# Patient Record
Sex: Female | Born: 1960
Health system: Southern US, Community
[De-identification: ages and names within clinical notes are randomized; demographics above are authoritative.]

---

## 1998-01-18 ENCOUNTER — Other Ambulatory Visit: Admission: RE | Admit: 1998-01-18 | Discharge: 1998-01-18 | Payer: Self-pay | Admitting: Internal Medicine

## 1998-07-01 ENCOUNTER — Ambulatory Visit (HOSPITAL_COMMUNITY): Admission: RE | Admit: 1998-07-01 | Discharge: 1998-07-01 | Payer: Self-pay | Admitting: *Deleted

## 1998-07-15 ENCOUNTER — Ambulatory Visit (HOSPITAL_COMMUNITY): Admission: RE | Admit: 1998-07-15 | Discharge: 1998-07-15 | Payer: Self-pay | Admitting: *Deleted

## 1999-07-21 ENCOUNTER — Other Ambulatory Visit: Admission: RE | Admit: 1999-07-21 | Discharge: 1999-07-21 | Payer: Self-pay | Admitting: Internal Medicine

## 1999-08-09 ENCOUNTER — Ambulatory Visit (HOSPITAL_BASED_OUTPATIENT_CLINIC_OR_DEPARTMENT_OTHER): Admission: RE | Admit: 1999-08-09 | Discharge: 1999-08-09 | Payer: Self-pay | Admitting: Plastic Surgery

## 1999-08-09 ENCOUNTER — Encounter (INDEPENDENT_AMBULATORY_CARE_PROVIDER_SITE_OTHER): Payer: Self-pay | Admitting: Specialist

## 2000-06-05 ENCOUNTER — Inpatient Hospital Stay (HOSPITAL_COMMUNITY): Admission: EM | Admit: 2000-06-05 | Discharge: 2000-06-06 | Payer: Self-pay | Admitting: Emergency Medicine

## 2000-06-05 ENCOUNTER — Encounter: Payer: Self-pay | Admitting: Emergency Medicine

## 2000-07-09 ENCOUNTER — Ambulatory Visit (HOSPITAL_COMMUNITY): Admission: RE | Admit: 2000-07-09 | Discharge: 2000-07-09 | Payer: Self-pay | Admitting: Gastroenterology

## 2000-07-11 ENCOUNTER — Encounter: Admission: RE | Admit: 2000-07-11 | Discharge: 2000-07-11 | Payer: Self-pay | Admitting: Gastroenterology

## 2000-07-11 ENCOUNTER — Encounter: Payer: Self-pay | Admitting: Gastroenterology

## 2000-07-24 ENCOUNTER — Other Ambulatory Visit: Admission: RE | Admit: 2000-07-24 | Discharge: 2000-07-24 | Payer: Self-pay | Admitting: Internal Medicine

## 2000-08-23 ENCOUNTER — Ambulatory Visit (HOSPITAL_COMMUNITY): Admission: RE | Admit: 2000-08-23 | Discharge: 2000-08-23 | Payer: Self-pay | Admitting: Gastroenterology

## 2000-08-23 ENCOUNTER — Encounter: Payer: Self-pay | Admitting: Gastroenterology

## 2000-08-29 ENCOUNTER — Encounter: Payer: Self-pay | Admitting: Gastroenterology

## 2000-08-29 ENCOUNTER — Encounter: Admission: RE | Admit: 2000-08-29 | Discharge: 2000-08-29 | Payer: Self-pay | Admitting: Gastroenterology

## 2001-07-24 ENCOUNTER — Other Ambulatory Visit: Admission: RE | Admit: 2001-07-24 | Discharge: 2001-07-24 | Payer: Self-pay | Admitting: Internal Medicine

## 2003-06-30 ENCOUNTER — Ambulatory Visit (HOSPITAL_COMMUNITY): Admission: RE | Admit: 2003-06-30 | Discharge: 2003-06-30 | Payer: Self-pay | Admitting: Plastic Surgery

## 2003-06-30 ENCOUNTER — Encounter (INDEPENDENT_AMBULATORY_CARE_PROVIDER_SITE_OTHER): Payer: Self-pay | Admitting: Plastic Surgery

## 2003-06-30 ENCOUNTER — Ambulatory Visit (HOSPITAL_BASED_OUTPATIENT_CLINIC_OR_DEPARTMENT_OTHER): Admission: RE | Admit: 2003-06-30 | Discharge: 2003-06-30 | Payer: Self-pay | Admitting: Plastic Surgery

## 2003-08-04 ENCOUNTER — Other Ambulatory Visit: Admission: RE | Admit: 2003-08-04 | Discharge: 2003-08-04 | Payer: Self-pay | Admitting: Internal Medicine

## 2004-08-09 ENCOUNTER — Other Ambulatory Visit: Admission: RE | Admit: 2004-08-09 | Discharge: 2004-08-09 | Payer: Self-pay | Admitting: Internal Medicine

## 2004-08-19 ENCOUNTER — Encounter: Admission: RE | Admit: 2004-08-19 | Discharge: 2004-08-19 | Payer: Self-pay | Admitting: Internal Medicine

## 2004-10-28 ENCOUNTER — Encounter: Admission: RE | Admit: 2004-10-28 | Discharge: 2004-10-28 | Payer: Self-pay | Admitting: Internal Medicine

## 2005-08-04 ENCOUNTER — Other Ambulatory Visit: Admission: RE | Admit: 2005-08-04 | Discharge: 2005-08-04 | Payer: Self-pay | Admitting: Internal Medicine

## 2005-11-16 ENCOUNTER — Other Ambulatory Visit: Admission: RE | Admit: 2005-11-16 | Discharge: 2005-11-16 | Payer: Self-pay | Admitting: Internal Medicine

## 2006-09-27 ENCOUNTER — Other Ambulatory Visit: Admission: RE | Admit: 2006-09-27 | Discharge: 2006-09-27 | Payer: Self-pay | Admitting: Internal Medicine

## 2007-11-05 ENCOUNTER — Other Ambulatory Visit: Admission: RE | Admit: 2007-11-05 | Discharge: 2007-11-05 | Payer: Self-pay | Admitting: Obstetrics and Gynecology

## 2008-12-16 ENCOUNTER — Other Ambulatory Visit: Admission: RE | Admit: 2008-12-16 | Discharge: 2008-12-16 | Payer: Self-pay | Admitting: Internal Medicine

## 2009-01-18 ENCOUNTER — Ambulatory Visit (HOSPITAL_COMMUNITY): Admission: RE | Admit: 2009-01-18 | Discharge: 2009-01-18 | Payer: Self-pay | Admitting: Chiropractic Medicine

## 2009-08-31 ENCOUNTER — Ambulatory Visit: Payer: Self-pay | Admitting: Diagnostic Radiology

## 2009-08-31 ENCOUNTER — Emergency Department (HOSPITAL_BASED_OUTPATIENT_CLINIC_OR_DEPARTMENT_OTHER): Admission: EM | Admit: 2009-08-31 | Discharge: 2009-08-31 | Payer: Self-pay | Admitting: Emergency Medicine

## 2010-03-20 ENCOUNTER — Encounter: Payer: Self-pay | Admitting: Internal Medicine

## 2010-07-15 NOTE — H&P (Signed)
[place]. [facility]  Patient:    Jocelyn, Gutierrez                         MRN: [id number] Adm. Date:  [id number] Attending:  Osvaldo Human Dictator:   Anselm Lis, N.P. CC:         Thora Lance, M.D.   History and Physical  DATE OF BIRTH: [birth year]  DIAGNOSES:  1. Constellation of symptoms including upper extremities and lower facial     numbness, chest heaviness, paresthesias of upper extremities; uncertain     etiology.  2. Episode of ectopic atrial pacemaker short PR interval of uncertain     significance.  PLAN: Admit to telemetry for rule out myocardial infarction protocol with serial cardiac enzymes and daily electrocardiogram.  Continue Protonix and Celebrex.  Question need for stress Cardiolite to rule out myocardial ischemia.  May need neurologic evaluation, inpatient versus outpatient, and/or GI evaluation.  HISTORY OF PRESENT ILLNESS: Jocelyn Gutierrez is a pleasant 50 year old female with minimal risk factors for CAD, who was experiencing anterior sharp episodic chest discomfort over variable locations of her anterior chest over the last four weeks.  The discomfort seemed to come in series, lasting approximately ten minutes at a time.  She has had no associated symptoms with this, though has felt a little bit of malaise.  She had follow-up with her primary care Jocelyn Gutierrez two weeks earlier and at that time she was started on Aciphex and Vioxx with no real improvement.  Last week she experienced an episode of lower anterior/epigastric chest pressure/burning with associated slight shortness of breath.  She reports to her primary care, where Vioxx was changed to Celebrex. She was continued on Aciphex.  Chest x-ray and laboratory work-up were okay. The discomfort seemed muscular/skeletal in nature as it could be reproduced with applied pressure.  Since that time her anterior sharp chest pressure episodes have improved and her chest pressure  resolved after a few days.  This morning she awoke feeling extremely nauseous.  She went to the bathroom and had a loose stool, and had abdominal cramping.  She subsequently has developed sudden onset of lower facial numbness which progressed to include bilateral upper extremities and anterior chest.  She developed anterior chest heaviness/ tightness and felt very weak and dizzy.  No shortness of breath.  She did feel diaphoretic.  She initially was evaluated by police responders after dialing 911 but as she felt better she declined further intervention at that time. When they left she again had recurrence of the anterior chest heaviness and progression of the numbness.  She was driven to the hospital by her friends. The patient did have with this latest episode a feeling of bilateral arm coldness, left greater than right.  When she has episodes of chest tightness her friends note that she appears somewhat glassy-eyed.  The patient feels that she has a feeling of dissociation at these times.  PAST MEDICAL/SURGICAL HISTORY: Denies history of diabetes, thyroid disease, asthma, or cancer.  1. She did have melanoma resected of the left lower extremity.  2. Other basal cell carcinomas have been resected.  3. History of tonsillectomy.  SOCIAL HISTORY: Tobacco negative.  ETOH intake is episodic glass of wine. Caffeine intake of two to three cups of coffee a day.  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  She has a daughter, age 46, alive and well.  She  is single.  FAMILY HISTORY: Both parents are age 83 and doing fine.  Her father has had a history of stomach cancer and peripheral vascular disease.  She has a brother, age 28, alive and well.  REVIEW OF SYSTEMS: As noted in the HPI/Past Medical History.  Denies history of light headedness, dizziness, syncope, or near syncopal episodes.  Denies problems with loss of vision or hearing.  Denies dysphagia with food  or fluids.  No symptoms of GERD.  Negative constipation, diarrhea, melena. Episodic bright red blood per rectum which she attributes to possible hemorrhoids.  Negative dysuria or hematuria.  Lower back pain for which she has had steroid injections in the past (discomfort status post fall to the floor when she was on an airplane).  She does have some lower extremity dependent swelling.  Has noted a weight gain of 15-20 pounds over the last month.  PHYSICAL EXAMINATION:  VITAL SIGNS: Blood pressure 99/75 (her baseline), temperature 97.1 degrees, heart arte 82 and regular, respiratory rate 24.  Oxygen saturation 100% on room air.  GENERAL: She is a well-nourished, middle-aged female in no apparent distress. Her friend accompanies her today.  HEENT: Bilateral carotid upstrokes without bruits.  NECK: No JVD, no thyromegaly.  CHEST: Lungs sounds clear with equal bilateral excursion.  Negative CVA tenderness.  CARDIAC: Regular rate and rhythm without murmurs, rubs, or gallops.  Split S1, normal S2.  ABDOMEN: Soft, nondistended, normoactive bowel sounds.  Negative abdominal aortic, renal, or left femoral bruits.  Nontender to applied pressure.  No masses, no organomegaly appreciated.  EXTREMITIES: Distal pulses intact.  Negative pedal edema.  NEUROLOGIC: Cranial nerves 2-12 grossly intact.  Alert and oriented x 3.  GU/RECTAL: Examinations deferred.  LABORATORY DATA: Hemoglobin 11.8, hematocrit 34.6, WBC 6.9; platelets 217,000. Sodium 137, potassium 3.8, chloride 107, CO2 25, BUN 21, creatinine 0.7, glucose 108.  CK 65, MB fraction 1.1, troponin I less than 0.01.  EKG reveals NSR at 64 beats per minute, no ischemic changes.  She did have an EKG shortly after arrival in the ER at 4:28 a.m. revealing ectopic atrial rhythm with short PR interval, nonischemic. DD:  06/05/00 TD:  06/05/00 Job: [id number]

## 2010-07-15 NOTE — Procedures (Signed)
[place]. [facility]  Patient:    Jocelyn Gutierrez, Jocelyn Gutierrez                         MRN: [id number] Proc. Date: 07/09/00 Adm. Date:  [id number] Attending:  Orland Mustard CC:         Thora Lance, M.D.   Procedure Report  PROCEDURE:  Esophagogastroduodenoscopy.  SURGEON:  James L. Edwards, M.D.  MEDICATIONS:  Hurricaine spray, fentanyl 40 mcg, and Versed 7 mg IV.  INDICATIONS:  Epigastric pain of unclear etiology.  DESCRIPTION OF PROCEDURE:  The procedure had been explained to the patient and consent obtained.  With the patient in the left lateral decubitus position, the Olympus videoscope was inserted and advanced under direct visualization. The esophagus was entered and the stomach was entered, the pylorus identified and passed.  The duodenum including the bulb and second portion were seen well and was unremarkable.  There was no ulceration __________.  The scope was withdrawn back into the stomach.  The antrum and body were seen well and were normal.  No ulceration or inflammation.  The fundus and cardia were seen well on retroflexed view and were normal.  There was a hiatal hernia with a patent GE junction with somewhat reddened esophagus consistent with mild esophageal reflux disease.  There is no ulcerations.  The remainder of the esophagus is endoscopically normal.  The scope was withdrawn.  The patient tolerated the procedure well.  She was maintained on low flow oxygen and pulse oximeter throughout the procedure.  ASSESSMENT:  Hiatal hernia with probable gastroesophageal reflux disease.  PLAN:  Will continue on Nexium 40 mg daily for now, give sheet of anti-reflux instructions.  The patient is already scheduled for an ultrasound, obtain that, and seen back in the office in 3-4 weeks. DD:  07/09/00 TD:  07/10/00 Job: [id number]

## 2010-08-18 ENCOUNTER — Other Ambulatory Visit: Payer: Self-pay | Admitting: Dermatology

## 2010-08-30 ENCOUNTER — Other Ambulatory Visit: Payer: Self-pay | Admitting: Internal Medicine

## 2010-08-30 ENCOUNTER — Other Ambulatory Visit (HOSPITAL_COMMUNITY)
Admission: RE | Admit: 2010-08-30 | Discharge: 2010-08-30 | Disposition: A | Discharge status: Home / Self Care | Payer: 59 | Source: Ambulatory Visit | Attending: Internal Medicine | Admitting: Internal Medicine

## 2010-08-30 DIAGNOSIS — Z1159 Encounter for screening for other viral diseases: Secondary | ICD-10-CM | POA: Insufficient documentation

## 2010-08-30 DIAGNOSIS — Z01419 Encounter for gynecological examination (general) (routine) without abnormal findings: Secondary | ICD-10-CM | POA: Insufficient documentation

## 2010-09-13 ENCOUNTER — Other Ambulatory Visit: Payer: Self-pay | Admitting: Dermatology

## 2011-08-30 ENCOUNTER — Other Ambulatory Visit: Payer: Self-pay | Admitting: Dermatology

## 2011-09-15 ENCOUNTER — Other Ambulatory Visit: Payer: Self-pay | Admitting: Internal Medicine

## 2011-09-15 ENCOUNTER — Other Ambulatory Visit (HOSPITAL_COMMUNITY)
Admission: RE | Admit: 2011-09-15 | Discharge: 2011-09-15 | Disposition: A | Discharge status: Home / Self Care | Payer: 59 | Source: Ambulatory Visit | Attending: Internal Medicine | Admitting: Internal Medicine

## 2011-09-15 DIAGNOSIS — Z01419 Encounter for gynecological examination (general) (routine) without abnormal findings: Secondary | ICD-10-CM | POA: Insufficient documentation

## 2011-09-15 DIAGNOSIS — Z1151 Encounter for screening for human papillomavirus (HPV): Secondary | ICD-10-CM | POA: Insufficient documentation

## 2012-01-18 ENCOUNTER — Other Ambulatory Visit (HOSPITAL_COMMUNITY)
Admission: RE | Admit: 2012-01-18 | Discharge: 2012-01-18 | Disposition: A | Discharge status: Home / Self Care | Payer: 59 | Source: Ambulatory Visit | Attending: Internal Medicine | Admitting: Internal Medicine

## 2012-01-18 ENCOUNTER — Other Ambulatory Visit: Payer: Self-pay | Admitting: Internal Medicine

## 2012-01-18 DIAGNOSIS — Z1151 Encounter for screening for human papillomavirus (HPV): Secondary | ICD-10-CM | POA: Insufficient documentation

## 2012-01-18 DIAGNOSIS — Z01419 Encounter for gynecological examination (general) (routine) without abnormal findings: Secondary | ICD-10-CM | POA: Insufficient documentation

## 2013-03-05 ENCOUNTER — Other Ambulatory Visit: Payer: Self-pay | Admitting: Dermatology

## 2013-09-03 ENCOUNTER — Other Ambulatory Visit: Payer: Self-pay | Admitting: Dermatology

## 2013-09-15 ENCOUNTER — Other Ambulatory Visit: Payer: Self-pay | Admitting: Obstetrics and Gynecology

## 2014-03-25 ENCOUNTER — Other Ambulatory Visit: Payer: Self-pay | Admitting: Obstetrics and Gynecology

## 2014-03-26 LAB — CYTOLOGY - PAP

## 2014-09-03 ENCOUNTER — Other Ambulatory Visit: Payer: Self-pay | Admitting: Obstetrics and Gynecology

## 2014-09-04 LAB — CYTOLOGY - PAP

## 2015-06-10 DIAGNOSIS — M9902 Segmental and somatic dysfunction of thoracic region: Secondary | ICD-10-CM | POA: Diagnosis not present

## 2015-06-10 DIAGNOSIS — M62830 Muscle spasm of back: Secondary | ICD-10-CM | POA: Diagnosis not present

## 2015-06-10 DIAGNOSIS — M9903 Segmental and somatic dysfunction of lumbar region: Secondary | ICD-10-CM | POA: Diagnosis not present

## 2015-06-10 DIAGNOSIS — M9905 Segmental and somatic dysfunction of pelvic region: Secondary | ICD-10-CM | POA: Diagnosis not present

## 2015-06-10 DIAGNOSIS — E559 Vitamin D deficiency, unspecified: Secondary | ICD-10-CM | POA: Diagnosis not present

## 2015-06-22 DIAGNOSIS — M9905 Segmental and somatic dysfunction of pelvic region: Secondary | ICD-10-CM | POA: Diagnosis not present

## 2015-06-22 DIAGNOSIS — M9903 Segmental and somatic dysfunction of lumbar region: Secondary | ICD-10-CM | POA: Diagnosis not present

## 2015-06-22 DIAGNOSIS — M9902 Segmental and somatic dysfunction of thoracic region: Secondary | ICD-10-CM | POA: Diagnosis not present

## 2015-06-22 DIAGNOSIS — M62830 Muscle spasm of back: Secondary | ICD-10-CM | POA: Diagnosis not present

## 2015-07-01 DIAGNOSIS — M62830 Muscle spasm of back: Secondary | ICD-10-CM | POA: Diagnosis not present

## 2015-07-01 DIAGNOSIS — M9905 Segmental and somatic dysfunction of pelvic region: Secondary | ICD-10-CM | POA: Diagnosis not present

## 2015-07-01 DIAGNOSIS — M9902 Segmental and somatic dysfunction of thoracic region: Secondary | ICD-10-CM | POA: Diagnosis not present

## 2015-07-01 DIAGNOSIS — M9903 Segmental and somatic dysfunction of lumbar region: Secondary | ICD-10-CM | POA: Diagnosis not present

## 2015-07-15 DIAGNOSIS — M9902 Segmental and somatic dysfunction of thoracic region: Secondary | ICD-10-CM | POA: Diagnosis not present

## 2015-07-15 DIAGNOSIS — M9903 Segmental and somatic dysfunction of lumbar region: Secondary | ICD-10-CM | POA: Diagnosis not present

## 2015-07-15 DIAGNOSIS — M9905 Segmental and somatic dysfunction of pelvic region: Secondary | ICD-10-CM | POA: Diagnosis not present

## 2015-07-15 DIAGNOSIS — M62830 Muscle spasm of back: Secondary | ICD-10-CM | POA: Diagnosis not present

## 2015-07-28 DIAGNOSIS — M9905 Segmental and somatic dysfunction of pelvic region: Secondary | ICD-10-CM | POA: Diagnosis not present

## 2015-07-28 DIAGNOSIS — M9903 Segmental and somatic dysfunction of lumbar region: Secondary | ICD-10-CM | POA: Diagnosis not present

## 2015-07-28 DIAGNOSIS — M9902 Segmental and somatic dysfunction of thoracic region: Secondary | ICD-10-CM | POA: Diagnosis not present

## 2015-07-28 DIAGNOSIS — M62830 Muscle spasm of back: Secondary | ICD-10-CM | POA: Diagnosis not present

## 2015-08-06 DIAGNOSIS — M9905 Segmental and somatic dysfunction of pelvic region: Secondary | ICD-10-CM | POA: Diagnosis not present

## 2015-08-06 DIAGNOSIS — M62830 Muscle spasm of back: Secondary | ICD-10-CM | POA: Diagnosis not present

## 2015-08-06 DIAGNOSIS — M9902 Segmental and somatic dysfunction of thoracic region: Secondary | ICD-10-CM | POA: Diagnosis not present

## 2015-08-06 DIAGNOSIS — M9903 Segmental and somatic dysfunction of lumbar region: Secondary | ICD-10-CM | POA: Diagnosis not present

## 2015-08-18 DIAGNOSIS — J019 Acute sinusitis, unspecified: Secondary | ICD-10-CM | POA: Diagnosis not present

## 2015-10-01 DIAGNOSIS — M9905 Segmental and somatic dysfunction of pelvic region: Secondary | ICD-10-CM | POA: Diagnosis not present

## 2015-10-01 DIAGNOSIS — M9902 Segmental and somatic dysfunction of thoracic region: Secondary | ICD-10-CM | POA: Diagnosis not present

## 2015-10-01 DIAGNOSIS — M62830 Muscle spasm of back: Secondary | ICD-10-CM | POA: Diagnosis not present

## 2015-10-01 DIAGNOSIS — M9903 Segmental and somatic dysfunction of lumbar region: Secondary | ICD-10-CM | POA: Diagnosis not present

## 2015-10-05 DIAGNOSIS — Z111 Encounter for screening for respiratory tuberculosis: Secondary | ICD-10-CM | POA: Diagnosis not present

## 2015-10-21 DIAGNOSIS — L821 Other seborrheic keratosis: Secondary | ICD-10-CM | POA: Diagnosis not present

## 2015-10-21 DIAGNOSIS — Z85828 Personal history of other malignant neoplasm of skin: Secondary | ICD-10-CM | POA: Diagnosis not present

## 2015-10-21 DIAGNOSIS — L570 Actinic keratosis: Secondary | ICD-10-CM | POA: Diagnosis not present

## 2015-10-21 DIAGNOSIS — D1801 Hemangioma of skin and subcutaneous tissue: Secondary | ICD-10-CM | POA: Diagnosis not present

## 2015-10-21 DIAGNOSIS — Z85820 Personal history of malignant melanoma of skin: Secondary | ICD-10-CM | POA: Diagnosis not present

## 2015-10-21 DIAGNOSIS — C44629 Squamous cell carcinoma of skin of left upper limb, including shoulder: Secondary | ICD-10-CM | POA: Diagnosis not present

## 2015-10-22 DIAGNOSIS — M9903 Segmental and somatic dysfunction of lumbar region: Secondary | ICD-10-CM | POA: Diagnosis not present

## 2015-10-22 DIAGNOSIS — M9905 Segmental and somatic dysfunction of pelvic region: Secondary | ICD-10-CM | POA: Diagnosis not present

## 2015-10-22 DIAGNOSIS — M62830 Muscle spasm of back: Secondary | ICD-10-CM | POA: Diagnosis not present

## 2015-10-22 DIAGNOSIS — M9902 Segmental and somatic dysfunction of thoracic region: Secondary | ICD-10-CM | POA: Diagnosis not present

## 2015-10-27 DIAGNOSIS — M9905 Segmental and somatic dysfunction of pelvic region: Secondary | ICD-10-CM | POA: Diagnosis not present

## 2015-10-27 DIAGNOSIS — M62830 Muscle spasm of back: Secondary | ICD-10-CM | POA: Diagnosis not present

## 2015-10-27 DIAGNOSIS — M9903 Segmental and somatic dysfunction of lumbar region: Secondary | ICD-10-CM | POA: Diagnosis not present

## 2015-10-27 DIAGNOSIS — M9902 Segmental and somatic dysfunction of thoracic region: Secondary | ICD-10-CM | POA: Diagnosis not present

## 2015-11-05 DIAGNOSIS — M9903 Segmental and somatic dysfunction of lumbar region: Secondary | ICD-10-CM | POA: Diagnosis not present

## 2015-11-05 DIAGNOSIS — M9905 Segmental and somatic dysfunction of pelvic region: Secondary | ICD-10-CM | POA: Diagnosis not present

## 2015-11-05 DIAGNOSIS — M62830 Muscle spasm of back: Secondary | ICD-10-CM | POA: Diagnosis not present

## 2015-11-05 DIAGNOSIS — M9902 Segmental and somatic dysfunction of thoracic region: Secondary | ICD-10-CM | POA: Diagnosis not present

## 2015-11-09 DIAGNOSIS — M9902 Segmental and somatic dysfunction of thoracic region: Secondary | ICD-10-CM | POA: Diagnosis not present

## 2015-11-09 DIAGNOSIS — M9905 Segmental and somatic dysfunction of pelvic region: Secondary | ICD-10-CM | POA: Diagnosis not present

## 2015-11-09 DIAGNOSIS — M9903 Segmental and somatic dysfunction of lumbar region: Secondary | ICD-10-CM | POA: Diagnosis not present

## 2015-11-09 DIAGNOSIS — M62830 Muscle spasm of back: Secondary | ICD-10-CM | POA: Diagnosis not present

## 2016-01-14 DIAGNOSIS — Z1231 Encounter for screening mammogram for malignant neoplasm of breast: Secondary | ICD-10-CM | POA: Diagnosis not present

## 2016-01-28 DIAGNOSIS — M62830 Muscle spasm of back: Secondary | ICD-10-CM | POA: Diagnosis not present

## 2016-01-28 DIAGNOSIS — M9905 Segmental and somatic dysfunction of pelvic region: Secondary | ICD-10-CM | POA: Diagnosis not present

## 2016-01-28 DIAGNOSIS — M9903 Segmental and somatic dysfunction of lumbar region: Secondary | ICD-10-CM | POA: Diagnosis not present

## 2016-01-28 DIAGNOSIS — M9902 Segmental and somatic dysfunction of thoracic region: Secondary | ICD-10-CM | POA: Diagnosis not present

## 2016-02-18 DIAGNOSIS — M62830 Muscle spasm of back: Secondary | ICD-10-CM | POA: Diagnosis not present

## 2016-02-18 DIAGNOSIS — M9905 Segmental and somatic dysfunction of pelvic region: Secondary | ICD-10-CM | POA: Diagnosis not present

## 2016-02-18 DIAGNOSIS — M9902 Segmental and somatic dysfunction of thoracic region: Secondary | ICD-10-CM | POA: Diagnosis not present

## 2016-02-18 DIAGNOSIS — M9903 Segmental and somatic dysfunction of lumbar region: Secondary | ICD-10-CM | POA: Diagnosis not present

## 2016-04-04 DIAGNOSIS — Z13820 Encounter for screening for osteoporosis: Secondary | ICD-10-CM | POA: Diagnosis not present

## 2016-04-04 DIAGNOSIS — Z6827 Body mass index (BMI) 27.0-27.9, adult: Secondary | ICD-10-CM | POA: Diagnosis not present

## 2016-04-04 DIAGNOSIS — Z01419 Encounter for gynecological examination (general) (routine) without abnormal findings: Secondary | ICD-10-CM | POA: Diagnosis not present

## 2016-04-11 DIAGNOSIS — M9905 Segmental and somatic dysfunction of pelvic region: Secondary | ICD-10-CM | POA: Diagnosis not present

## 2016-04-11 DIAGNOSIS — M62830 Muscle spasm of back: Secondary | ICD-10-CM | POA: Diagnosis not present

## 2016-04-11 DIAGNOSIS — M9902 Segmental and somatic dysfunction of thoracic region: Secondary | ICD-10-CM | POA: Diagnosis not present

## 2016-04-11 DIAGNOSIS — M9903 Segmental and somatic dysfunction of lumbar region: Secondary | ICD-10-CM | POA: Diagnosis not present

## 2016-04-18 DIAGNOSIS — M9902 Segmental and somatic dysfunction of thoracic region: Secondary | ICD-10-CM | POA: Diagnosis not present

## 2016-04-18 DIAGNOSIS — M9903 Segmental and somatic dysfunction of lumbar region: Secondary | ICD-10-CM | POA: Diagnosis not present

## 2016-04-18 DIAGNOSIS — M9905 Segmental and somatic dysfunction of pelvic region: Secondary | ICD-10-CM | POA: Diagnosis not present

## 2016-04-18 DIAGNOSIS — M62830 Muscle spasm of back: Secondary | ICD-10-CM | POA: Diagnosis not present

## 2016-04-20 DIAGNOSIS — M545 Low back pain: Secondary | ICD-10-CM | POA: Diagnosis not present

## 2016-04-21 DIAGNOSIS — M9902 Segmental and somatic dysfunction of thoracic region: Secondary | ICD-10-CM | POA: Diagnosis not present

## 2016-04-21 DIAGNOSIS — M9905 Segmental and somatic dysfunction of pelvic region: Secondary | ICD-10-CM | POA: Diagnosis not present

## 2016-04-21 DIAGNOSIS — M62830 Muscle spasm of back: Secondary | ICD-10-CM | POA: Diagnosis not present

## 2016-04-21 DIAGNOSIS — M9903 Segmental and somatic dysfunction of lumbar region: Secondary | ICD-10-CM | POA: Diagnosis not present

## 2016-04-25 DIAGNOSIS — M9902 Segmental and somatic dysfunction of thoracic region: Secondary | ICD-10-CM | POA: Diagnosis not present

## 2016-04-25 DIAGNOSIS — M62830 Muscle spasm of back: Secondary | ICD-10-CM | POA: Diagnosis not present

## 2016-04-25 DIAGNOSIS — M9905 Segmental and somatic dysfunction of pelvic region: Secondary | ICD-10-CM | POA: Diagnosis not present

## 2016-04-25 DIAGNOSIS — M9903 Segmental and somatic dysfunction of lumbar region: Secondary | ICD-10-CM | POA: Diagnosis not present

## 2016-04-27 DIAGNOSIS — M9903 Segmental and somatic dysfunction of lumbar region: Secondary | ICD-10-CM | POA: Diagnosis not present

## 2016-04-27 DIAGNOSIS — M9905 Segmental and somatic dysfunction of pelvic region: Secondary | ICD-10-CM | POA: Diagnosis not present

## 2016-04-27 DIAGNOSIS — M62830 Muscle spasm of back: Secondary | ICD-10-CM | POA: Diagnosis not present

## 2016-04-27 DIAGNOSIS — M9902 Segmental and somatic dysfunction of thoracic region: Secondary | ICD-10-CM | POA: Diagnosis not present

## 2016-04-29 DIAGNOSIS — M9903 Segmental and somatic dysfunction of lumbar region: Secondary | ICD-10-CM | POA: Diagnosis not present

## 2016-04-29 DIAGNOSIS — M62830 Muscle spasm of back: Secondary | ICD-10-CM | POA: Diagnosis not present

## 2016-04-29 DIAGNOSIS — M9902 Segmental and somatic dysfunction of thoracic region: Secondary | ICD-10-CM | POA: Diagnosis not present

## 2016-04-29 DIAGNOSIS — M9905 Segmental and somatic dysfunction of pelvic region: Secondary | ICD-10-CM | POA: Diagnosis not present

## 2016-05-02 DIAGNOSIS — M9903 Segmental and somatic dysfunction of lumbar region: Secondary | ICD-10-CM | POA: Diagnosis not present

## 2016-05-02 DIAGNOSIS — M9902 Segmental and somatic dysfunction of thoracic region: Secondary | ICD-10-CM | POA: Diagnosis not present

## 2016-05-02 DIAGNOSIS — M62830 Muscle spasm of back: Secondary | ICD-10-CM | POA: Diagnosis not present

## 2016-05-02 DIAGNOSIS — M9905 Segmental and somatic dysfunction of pelvic region: Secondary | ICD-10-CM | POA: Diagnosis not present

## 2016-05-04 DIAGNOSIS — M545 Low back pain: Secondary | ICD-10-CM | POA: Diagnosis not present

## 2016-05-04 DIAGNOSIS — M25552 Pain in left hip: Secondary | ICD-10-CM | POA: Diagnosis not present

## 2016-05-04 DIAGNOSIS — M9903 Segmental and somatic dysfunction of lumbar region: Secondary | ICD-10-CM | POA: Diagnosis not present

## 2016-05-04 DIAGNOSIS — M25551 Pain in right hip: Secondary | ICD-10-CM | POA: Diagnosis not present

## 2016-05-04 DIAGNOSIS — M62830 Muscle spasm of back: Secondary | ICD-10-CM | POA: Diagnosis not present

## 2016-05-04 DIAGNOSIS — M9905 Segmental and somatic dysfunction of pelvic region: Secondary | ICD-10-CM | POA: Diagnosis not present

## 2016-05-04 DIAGNOSIS — M9902 Segmental and somatic dysfunction of thoracic region: Secondary | ICD-10-CM | POA: Diagnosis not present

## 2016-05-09 DIAGNOSIS — M545 Low back pain: Secondary | ICD-10-CM | POA: Diagnosis not present

## 2016-05-09 DIAGNOSIS — M25552 Pain in left hip: Secondary | ICD-10-CM | POA: Diagnosis not present

## 2016-05-09 DIAGNOSIS — M25551 Pain in right hip: Secondary | ICD-10-CM | POA: Diagnosis not present

## 2016-05-10 DIAGNOSIS — M9903 Segmental and somatic dysfunction of lumbar region: Secondary | ICD-10-CM | POA: Diagnosis not present

## 2016-05-10 DIAGNOSIS — M9902 Segmental and somatic dysfunction of thoracic region: Secondary | ICD-10-CM | POA: Diagnosis not present

## 2016-05-10 DIAGNOSIS — M9905 Segmental and somatic dysfunction of pelvic region: Secondary | ICD-10-CM | POA: Diagnosis not present

## 2016-05-10 DIAGNOSIS — M25552 Pain in left hip: Secondary | ICD-10-CM | POA: Diagnosis not present

## 2016-05-10 DIAGNOSIS — M545 Low back pain: Secondary | ICD-10-CM | POA: Diagnosis not present

## 2016-05-10 DIAGNOSIS — M62830 Muscle spasm of back: Secondary | ICD-10-CM | POA: Diagnosis not present

## 2016-05-10 DIAGNOSIS — M25551 Pain in right hip: Secondary | ICD-10-CM | POA: Diagnosis not present

## 2016-05-17 DIAGNOSIS — M25552 Pain in left hip: Secondary | ICD-10-CM | POA: Diagnosis not present

## 2016-05-17 DIAGNOSIS — M545 Low back pain: Secondary | ICD-10-CM | POA: Diagnosis not present

## 2016-05-17 DIAGNOSIS — M25551 Pain in right hip: Secondary | ICD-10-CM | POA: Diagnosis not present

## 2016-05-18 DIAGNOSIS — M62830 Muscle spasm of back: Secondary | ICD-10-CM | POA: Diagnosis not present

## 2016-05-18 DIAGNOSIS — M9902 Segmental and somatic dysfunction of thoracic region: Secondary | ICD-10-CM | POA: Diagnosis not present

## 2016-05-18 DIAGNOSIS — M9905 Segmental and somatic dysfunction of pelvic region: Secondary | ICD-10-CM | POA: Diagnosis not present

## 2016-05-18 DIAGNOSIS — M9903 Segmental and somatic dysfunction of lumbar region: Secondary | ICD-10-CM | POA: Diagnosis not present

## 2016-05-19 DIAGNOSIS — M545 Low back pain: Secondary | ICD-10-CM | POA: Diagnosis not present

## 2016-05-19 DIAGNOSIS — M25551 Pain in right hip: Secondary | ICD-10-CM | POA: Diagnosis not present

## 2016-05-19 DIAGNOSIS — M25552 Pain in left hip: Secondary | ICD-10-CM | POA: Diagnosis not present

## 2016-06-02 DIAGNOSIS — M25551 Pain in right hip: Secondary | ICD-10-CM | POA: Diagnosis not present

## 2016-06-02 DIAGNOSIS — M545 Low back pain: Secondary | ICD-10-CM | POA: Diagnosis not present

## 2016-06-02 DIAGNOSIS — M25552 Pain in left hip: Secondary | ICD-10-CM | POA: Diagnosis not present

## 2016-06-05 DIAGNOSIS — M25551 Pain in right hip: Secondary | ICD-10-CM | POA: Diagnosis not present

## 2016-06-05 DIAGNOSIS — M9905 Segmental and somatic dysfunction of pelvic region: Secondary | ICD-10-CM | POA: Diagnosis not present

## 2016-06-05 DIAGNOSIS — M25552 Pain in left hip: Secondary | ICD-10-CM | POA: Diagnosis not present

## 2016-06-05 DIAGNOSIS — M9902 Segmental and somatic dysfunction of thoracic region: Secondary | ICD-10-CM | POA: Diagnosis not present

## 2016-06-05 DIAGNOSIS — M62830 Muscle spasm of back: Secondary | ICD-10-CM | POA: Diagnosis not present

## 2016-06-05 DIAGNOSIS — M545 Low back pain: Secondary | ICD-10-CM | POA: Diagnosis not present

## 2016-06-05 DIAGNOSIS — M9903 Segmental and somatic dysfunction of lumbar region: Secondary | ICD-10-CM | POA: Diagnosis not present

## 2016-06-07 DIAGNOSIS — M545 Low back pain: Secondary | ICD-10-CM | POA: Diagnosis not present

## 2016-06-07 DIAGNOSIS — M25551 Pain in right hip: Secondary | ICD-10-CM | POA: Diagnosis not present

## 2016-06-07 DIAGNOSIS — M25552 Pain in left hip: Secondary | ICD-10-CM | POA: Diagnosis not present

## 2016-06-19 DIAGNOSIS — M25551 Pain in right hip: Secondary | ICD-10-CM | POA: Diagnosis not present

## 2016-06-19 DIAGNOSIS — M545 Low back pain: Secondary | ICD-10-CM | POA: Diagnosis not present

## 2016-06-19 DIAGNOSIS — M25552 Pain in left hip: Secondary | ICD-10-CM | POA: Diagnosis not present

## 2016-06-26 DIAGNOSIS — M25551 Pain in right hip: Secondary | ICD-10-CM | POA: Diagnosis not present

## 2016-06-26 DIAGNOSIS — M545 Low back pain: Secondary | ICD-10-CM | POA: Diagnosis not present

## 2016-06-26 DIAGNOSIS — M25552 Pain in left hip: Secondary | ICD-10-CM | POA: Diagnosis not present

## 2016-07-03 DIAGNOSIS — D485 Neoplasm of uncertain behavior of skin: Secondary | ICD-10-CM | POA: Diagnosis not present

## 2016-07-03 DIAGNOSIS — L821 Other seborrheic keratosis: Secondary | ICD-10-CM | POA: Diagnosis not present

## 2016-07-03 DIAGNOSIS — Z85828 Personal history of other malignant neoplasm of skin: Secondary | ICD-10-CM | POA: Diagnosis not present

## 2016-07-03 DIAGNOSIS — L570 Actinic keratosis: Secondary | ICD-10-CM | POA: Diagnosis not present

## 2016-07-03 DIAGNOSIS — L820 Inflamed seborrheic keratosis: Secondary | ICD-10-CM | POA: Diagnosis not present

## 2016-07-04 DIAGNOSIS — M25552 Pain in left hip: Secondary | ICD-10-CM | POA: Diagnosis not present

## 2016-07-04 DIAGNOSIS — M545 Low back pain: Secondary | ICD-10-CM | POA: Diagnosis not present

## 2016-07-04 DIAGNOSIS — M25551 Pain in right hip: Secondary | ICD-10-CM | POA: Diagnosis not present

## 2016-07-17 DIAGNOSIS — M25552 Pain in left hip: Secondary | ICD-10-CM | POA: Diagnosis not present

## 2016-07-17 DIAGNOSIS — M25551 Pain in right hip: Secondary | ICD-10-CM | POA: Diagnosis not present

## 2016-07-17 DIAGNOSIS — M545 Low back pain: Secondary | ICD-10-CM | POA: Diagnosis not present

## 2016-07-19 DIAGNOSIS — M25552 Pain in left hip: Secondary | ICD-10-CM | POA: Diagnosis not present

## 2016-07-19 DIAGNOSIS — M25551 Pain in right hip: Secondary | ICD-10-CM | POA: Diagnosis not present

## 2016-07-19 DIAGNOSIS — M545 Low back pain: Secondary | ICD-10-CM | POA: Diagnosis not present

## 2016-07-31 DIAGNOSIS — M25552 Pain in left hip: Secondary | ICD-10-CM | POA: Diagnosis not present

## 2016-07-31 DIAGNOSIS — M25551 Pain in right hip: Secondary | ICD-10-CM | POA: Diagnosis not present

## 2016-07-31 DIAGNOSIS — M545 Low back pain: Secondary | ICD-10-CM | POA: Diagnosis not present

## 2016-08-02 DIAGNOSIS — M545 Low back pain: Secondary | ICD-10-CM | POA: Diagnosis not present

## 2016-08-02 DIAGNOSIS — M25552 Pain in left hip: Secondary | ICD-10-CM | POA: Diagnosis not present

## 2016-08-02 DIAGNOSIS — M25551 Pain in right hip: Secondary | ICD-10-CM | POA: Diagnosis not present

## 2016-08-17 DIAGNOSIS — M25551 Pain in right hip: Secondary | ICD-10-CM | POA: Diagnosis not present

## 2016-08-17 DIAGNOSIS — M545 Low back pain: Secondary | ICD-10-CM | POA: Diagnosis not present

## 2016-08-17 DIAGNOSIS — M25552 Pain in left hip: Secondary | ICD-10-CM | POA: Diagnosis not present

## 2016-09-09 DIAGNOSIS — M9905 Segmental and somatic dysfunction of pelvic region: Secondary | ICD-10-CM | POA: Diagnosis not present

## 2016-09-09 DIAGNOSIS — M9902 Segmental and somatic dysfunction of thoracic region: Secondary | ICD-10-CM | POA: Diagnosis not present

## 2016-09-09 DIAGNOSIS — M62830 Muscle spasm of back: Secondary | ICD-10-CM | POA: Diagnosis not present

## 2016-09-09 DIAGNOSIS — M9903 Segmental and somatic dysfunction of lumbar region: Secondary | ICD-10-CM | POA: Diagnosis not present

## 2016-09-12 DIAGNOSIS — M9903 Segmental and somatic dysfunction of lumbar region: Secondary | ICD-10-CM | POA: Diagnosis not present

## 2016-09-12 DIAGNOSIS — M9902 Segmental and somatic dysfunction of thoracic region: Secondary | ICD-10-CM | POA: Diagnosis not present

## 2016-09-12 DIAGNOSIS — M9905 Segmental and somatic dysfunction of pelvic region: Secondary | ICD-10-CM | POA: Diagnosis not present

## 2016-09-12 DIAGNOSIS — M62830 Muscle spasm of back: Secondary | ICD-10-CM | POA: Diagnosis not present

## 2016-09-13 DIAGNOSIS — M25552 Pain in left hip: Secondary | ICD-10-CM | POA: Diagnosis not present

## 2016-09-13 DIAGNOSIS — M545 Low back pain: Secondary | ICD-10-CM | POA: Diagnosis not present

## 2016-09-13 DIAGNOSIS — M25551 Pain in right hip: Secondary | ICD-10-CM | POA: Diagnosis not present

## 2016-09-15 DIAGNOSIS — M9903 Segmental and somatic dysfunction of lumbar region: Secondary | ICD-10-CM | POA: Diagnosis not present

## 2016-09-15 DIAGNOSIS — M62830 Muscle spasm of back: Secondary | ICD-10-CM | POA: Diagnosis not present

## 2016-09-15 DIAGNOSIS — M9905 Segmental and somatic dysfunction of pelvic region: Secondary | ICD-10-CM | POA: Diagnosis not present

## 2016-09-15 DIAGNOSIS — M9902 Segmental and somatic dysfunction of thoracic region: Secondary | ICD-10-CM | POA: Diagnosis not present

## 2016-09-19 DIAGNOSIS — M545 Low back pain: Secondary | ICD-10-CM | POA: Diagnosis not present

## 2016-09-19 DIAGNOSIS — M25551 Pain in right hip: Secondary | ICD-10-CM | POA: Diagnosis not present

## 2016-09-19 DIAGNOSIS — M25552 Pain in left hip: Secondary | ICD-10-CM | POA: Diagnosis not present

## 2016-10-03 DIAGNOSIS — M545 Low back pain: Secondary | ICD-10-CM | POA: Diagnosis not present

## 2016-10-03 DIAGNOSIS — M25552 Pain in left hip: Secondary | ICD-10-CM | POA: Diagnosis not present

## 2016-10-03 DIAGNOSIS — M62830 Muscle spasm of back: Secondary | ICD-10-CM | POA: Diagnosis not present

## 2016-10-03 DIAGNOSIS — M9903 Segmental and somatic dysfunction of lumbar region: Secondary | ICD-10-CM | POA: Diagnosis not present

## 2016-10-03 DIAGNOSIS — M9902 Segmental and somatic dysfunction of thoracic region: Secondary | ICD-10-CM | POA: Diagnosis not present

## 2016-10-03 DIAGNOSIS — M25551 Pain in right hip: Secondary | ICD-10-CM | POA: Diagnosis not present

## 2016-10-03 DIAGNOSIS — M9905 Segmental and somatic dysfunction of pelvic region: Secondary | ICD-10-CM | POA: Diagnosis not present

## 2016-10-06 DIAGNOSIS — M545 Low back pain: Secondary | ICD-10-CM | POA: Diagnosis not present

## 2016-10-06 DIAGNOSIS — M9903 Segmental and somatic dysfunction of lumbar region: Secondary | ICD-10-CM | POA: Diagnosis not present

## 2016-10-06 DIAGNOSIS — M25552 Pain in left hip: Secondary | ICD-10-CM | POA: Diagnosis not present

## 2016-10-06 DIAGNOSIS — M9905 Segmental and somatic dysfunction of pelvic region: Secondary | ICD-10-CM | POA: Diagnosis not present

## 2016-10-06 DIAGNOSIS — M25551 Pain in right hip: Secondary | ICD-10-CM | POA: Diagnosis not present

## 2016-10-06 DIAGNOSIS — M62830 Muscle spasm of back: Secondary | ICD-10-CM | POA: Diagnosis not present

## 2016-10-06 DIAGNOSIS — M9902 Segmental and somatic dysfunction of thoracic region: Secondary | ICD-10-CM | POA: Diagnosis not present

## 2016-10-10 DIAGNOSIS — M9905 Segmental and somatic dysfunction of pelvic region: Secondary | ICD-10-CM | POA: Diagnosis not present

## 2016-10-10 DIAGNOSIS — M9903 Segmental and somatic dysfunction of lumbar region: Secondary | ICD-10-CM | POA: Diagnosis not present

## 2016-10-10 DIAGNOSIS — M62830 Muscle spasm of back: Secondary | ICD-10-CM | POA: Diagnosis not present

## 2016-10-10 DIAGNOSIS — M9902 Segmental and somatic dysfunction of thoracic region: Secondary | ICD-10-CM | POA: Diagnosis not present

## 2016-10-23 DIAGNOSIS — M9903 Segmental and somatic dysfunction of lumbar region: Secondary | ICD-10-CM | POA: Diagnosis not present

## 2016-10-23 DIAGNOSIS — M9905 Segmental and somatic dysfunction of pelvic region: Secondary | ICD-10-CM | POA: Diagnosis not present

## 2016-10-23 DIAGNOSIS — M9902 Segmental and somatic dysfunction of thoracic region: Secondary | ICD-10-CM | POA: Diagnosis not present

## 2016-10-23 DIAGNOSIS — M62830 Muscle spasm of back: Secondary | ICD-10-CM | POA: Diagnosis not present

## 2016-11-10 DIAGNOSIS — M9902 Segmental and somatic dysfunction of thoracic region: Secondary | ICD-10-CM | POA: Diagnosis not present

## 2016-11-10 DIAGNOSIS — M9905 Segmental and somatic dysfunction of pelvic region: Secondary | ICD-10-CM | POA: Diagnosis not present

## 2016-11-10 DIAGNOSIS — M62830 Muscle spasm of back: Secondary | ICD-10-CM | POA: Diagnosis not present

## 2016-11-10 DIAGNOSIS — M9903 Segmental and somatic dysfunction of lumbar region: Secondary | ICD-10-CM | POA: Diagnosis not present

## 2016-11-14 DIAGNOSIS — Z800 Family history of malignant neoplasm of digestive organs: Secondary | ICD-10-CM | POA: Diagnosis not present

## 2016-11-14 DIAGNOSIS — K640 First degree hemorrhoids: Secondary | ICD-10-CM | POA: Diagnosis not present

## 2016-11-14 DIAGNOSIS — Z86010 Personal history of colonic polyps: Secondary | ICD-10-CM | POA: Diagnosis not present

## 2016-11-16 DIAGNOSIS — M62830 Muscle spasm of back: Secondary | ICD-10-CM | POA: Diagnosis not present

## 2016-11-16 DIAGNOSIS — M9903 Segmental and somatic dysfunction of lumbar region: Secondary | ICD-10-CM | POA: Diagnosis not present

## 2016-11-16 DIAGNOSIS — M9902 Segmental and somatic dysfunction of thoracic region: Secondary | ICD-10-CM | POA: Diagnosis not present

## 2016-11-16 DIAGNOSIS — M9905 Segmental and somatic dysfunction of pelvic region: Secondary | ICD-10-CM | POA: Diagnosis not present

## 2016-11-18 DIAGNOSIS — M9902 Segmental and somatic dysfunction of thoracic region: Secondary | ICD-10-CM | POA: Diagnosis not present

## 2016-11-18 DIAGNOSIS — M9905 Segmental and somatic dysfunction of pelvic region: Secondary | ICD-10-CM | POA: Diagnosis not present

## 2016-11-18 DIAGNOSIS — M62830 Muscle spasm of back: Secondary | ICD-10-CM | POA: Diagnosis not present

## 2016-11-18 DIAGNOSIS — M9903 Segmental and somatic dysfunction of lumbar region: Secondary | ICD-10-CM | POA: Diagnosis not present

## 2016-11-27 DIAGNOSIS — M25552 Pain in left hip: Secondary | ICD-10-CM | POA: Diagnosis not present

## 2016-11-27 DIAGNOSIS — M545 Low back pain: Secondary | ICD-10-CM | POA: Diagnosis not present

## 2016-11-27 DIAGNOSIS — M25551 Pain in right hip: Secondary | ICD-10-CM | POA: Diagnosis not present

## 2016-11-29 DIAGNOSIS — M545 Low back pain: Secondary | ICD-10-CM | POA: Diagnosis not present

## 2016-11-29 DIAGNOSIS — M25551 Pain in right hip: Secondary | ICD-10-CM | POA: Diagnosis not present

## 2016-11-29 DIAGNOSIS — M25552 Pain in left hip: Secondary | ICD-10-CM | POA: Diagnosis not present

## 2016-12-04 DIAGNOSIS — M9902 Segmental and somatic dysfunction of thoracic region: Secondary | ICD-10-CM | POA: Diagnosis not present

## 2016-12-04 DIAGNOSIS — M62830 Muscle spasm of back: Secondary | ICD-10-CM | POA: Diagnosis not present

## 2016-12-04 DIAGNOSIS — M9905 Segmental and somatic dysfunction of pelvic region: Secondary | ICD-10-CM | POA: Diagnosis not present

## 2016-12-04 DIAGNOSIS — M9903 Segmental and somatic dysfunction of lumbar region: Secondary | ICD-10-CM | POA: Diagnosis not present

## 2016-12-12 DIAGNOSIS — M25552 Pain in left hip: Secondary | ICD-10-CM | POA: Diagnosis not present

## 2016-12-12 DIAGNOSIS — M25551 Pain in right hip: Secondary | ICD-10-CM | POA: Diagnosis not present

## 2016-12-12 DIAGNOSIS — M545 Low back pain: Secondary | ICD-10-CM | POA: Diagnosis not present

## 2016-12-14 DIAGNOSIS — M9905 Segmental and somatic dysfunction of pelvic region: Secondary | ICD-10-CM | POA: Diagnosis not present

## 2016-12-14 DIAGNOSIS — M62830 Muscle spasm of back: Secondary | ICD-10-CM | POA: Diagnosis not present

## 2016-12-14 DIAGNOSIS — M25551 Pain in right hip: Secondary | ICD-10-CM | POA: Diagnosis not present

## 2016-12-14 DIAGNOSIS — M9902 Segmental and somatic dysfunction of thoracic region: Secondary | ICD-10-CM | POA: Diagnosis not present

## 2016-12-14 DIAGNOSIS — M9903 Segmental and somatic dysfunction of lumbar region: Secondary | ICD-10-CM | POA: Diagnosis not present

## 2016-12-14 DIAGNOSIS — M545 Low back pain: Secondary | ICD-10-CM | POA: Diagnosis not present

## 2016-12-14 DIAGNOSIS — M25552 Pain in left hip: Secondary | ICD-10-CM | POA: Diagnosis not present

## 2016-12-22 DIAGNOSIS — M25552 Pain in left hip: Secondary | ICD-10-CM | POA: Diagnosis not present

## 2016-12-22 DIAGNOSIS — M545 Low back pain: Secondary | ICD-10-CM | POA: Diagnosis not present

## 2016-12-22 DIAGNOSIS — M25551 Pain in right hip: Secondary | ICD-10-CM | POA: Diagnosis not present

## 2017-03-01 DIAGNOSIS — Z803 Family history of malignant neoplasm of breast: Secondary | ICD-10-CM | POA: Diagnosis not present

## 2017-03-01 DIAGNOSIS — Z1231 Encounter for screening mammogram for malignant neoplasm of breast: Secondary | ICD-10-CM | POA: Diagnosis not present

## 2017-03-07 DIAGNOSIS — M545 Low back pain: Secondary | ICD-10-CM | POA: Diagnosis not present

## 2017-03-12 DIAGNOSIS — M545 Low back pain: Secondary | ICD-10-CM | POA: Diagnosis not present

## 2017-03-20 DIAGNOSIS — M545 Low back pain: Secondary | ICD-10-CM | POA: Diagnosis not present

## 2017-03-28 DIAGNOSIS — M545 Low back pain: Secondary | ICD-10-CM | POA: Diagnosis not present

## 2017-04-10 DIAGNOSIS — M545 Low back pain: Secondary | ICD-10-CM | POA: Diagnosis not present

## 2017-04-11 DIAGNOSIS — Z6826 Body mass index (BMI) 26.0-26.9, adult: Secondary | ICD-10-CM | POA: Diagnosis not present

## 2017-04-11 DIAGNOSIS — D2372 Other benign neoplasm of skin of left lower limb, including hip: Secondary | ICD-10-CM | POA: Diagnosis not present

## 2017-04-11 DIAGNOSIS — D2261 Melanocytic nevi of right upper limb, including shoulder: Secondary | ICD-10-CM | POA: Diagnosis not present

## 2017-04-11 DIAGNOSIS — L821 Other seborrheic keratosis: Secondary | ICD-10-CM | POA: Diagnosis not present

## 2017-04-11 DIAGNOSIS — L570 Actinic keratosis: Secondary | ICD-10-CM | POA: Diagnosis not present

## 2017-04-11 DIAGNOSIS — Z85828 Personal history of other malignant neoplasm of skin: Secondary | ICD-10-CM | POA: Diagnosis not present

## 2017-04-11 DIAGNOSIS — Z01419 Encounter for gynecological examination (general) (routine) without abnormal findings: Secondary | ICD-10-CM | POA: Diagnosis not present

## 2017-05-17 DIAGNOSIS — M545 Low back pain: Secondary | ICD-10-CM | POA: Diagnosis not present

## 2017-05-22 DIAGNOSIS — M545 Low back pain: Secondary | ICD-10-CM | POA: Diagnosis not present

## 2017-05-29 DIAGNOSIS — M545 Low back pain: Secondary | ICD-10-CM | POA: Diagnosis not present

## 2017-05-30 DIAGNOSIS — M9902 Segmental and somatic dysfunction of thoracic region: Secondary | ICD-10-CM | POA: Diagnosis not present

## 2017-05-30 DIAGNOSIS — M9905 Segmental and somatic dysfunction of pelvic region: Secondary | ICD-10-CM | POA: Diagnosis not present

## 2017-05-30 DIAGNOSIS — M62830 Muscle spasm of back: Secondary | ICD-10-CM | POA: Diagnosis not present

## 2017-05-30 DIAGNOSIS — M9903 Segmental and somatic dysfunction of lumbar region: Secondary | ICD-10-CM | POA: Diagnosis not present

## 2017-05-31 DIAGNOSIS — M9903 Segmental and somatic dysfunction of lumbar region: Secondary | ICD-10-CM | POA: Diagnosis not present

## 2017-05-31 DIAGNOSIS — M9905 Segmental and somatic dysfunction of pelvic region: Secondary | ICD-10-CM | POA: Diagnosis not present

## 2017-05-31 DIAGNOSIS — M62830 Muscle spasm of back: Secondary | ICD-10-CM | POA: Diagnosis not present

## 2017-05-31 DIAGNOSIS — M545 Low back pain: Secondary | ICD-10-CM | POA: Diagnosis not present

## 2017-05-31 DIAGNOSIS — M9902 Segmental and somatic dysfunction of thoracic region: Secondary | ICD-10-CM | POA: Diagnosis not present

## 2017-06-05 DIAGNOSIS — M9903 Segmental and somatic dysfunction of lumbar region: Secondary | ICD-10-CM | POA: Diagnosis not present

## 2017-06-05 DIAGNOSIS — M9905 Segmental and somatic dysfunction of pelvic region: Secondary | ICD-10-CM | POA: Diagnosis not present

## 2017-06-05 DIAGNOSIS — M62830 Muscle spasm of back: Secondary | ICD-10-CM | POA: Diagnosis not present

## 2017-06-05 DIAGNOSIS — M9902 Segmental and somatic dysfunction of thoracic region: Secondary | ICD-10-CM | POA: Diagnosis not present

## 2017-06-07 DIAGNOSIS — M62830 Muscle spasm of back: Secondary | ICD-10-CM | POA: Diagnosis not present

## 2017-06-07 DIAGNOSIS — M9903 Segmental and somatic dysfunction of lumbar region: Secondary | ICD-10-CM | POA: Diagnosis not present

## 2017-06-07 DIAGNOSIS — M9905 Segmental and somatic dysfunction of pelvic region: Secondary | ICD-10-CM | POA: Diagnosis not present

## 2017-06-07 DIAGNOSIS — M545 Low back pain: Secondary | ICD-10-CM | POA: Diagnosis not present

## 2017-06-07 DIAGNOSIS — M9902 Segmental and somatic dysfunction of thoracic region: Secondary | ICD-10-CM | POA: Diagnosis not present

## 2017-06-20 DIAGNOSIS — M9903 Segmental and somatic dysfunction of lumbar region: Secondary | ICD-10-CM | POA: Diagnosis not present

## 2017-06-20 DIAGNOSIS — M9905 Segmental and somatic dysfunction of pelvic region: Secondary | ICD-10-CM | POA: Diagnosis not present

## 2017-06-20 DIAGNOSIS — M545 Low back pain: Secondary | ICD-10-CM | POA: Diagnosis not present

## 2017-06-20 DIAGNOSIS — M9902 Segmental and somatic dysfunction of thoracic region: Secondary | ICD-10-CM | POA: Diagnosis not present

## 2017-06-20 DIAGNOSIS — M62830 Muscle spasm of back: Secondary | ICD-10-CM | POA: Diagnosis not present

## 2017-06-22 DIAGNOSIS — M9905 Segmental and somatic dysfunction of pelvic region: Secondary | ICD-10-CM | POA: Diagnosis not present

## 2017-06-22 DIAGNOSIS — M62830 Muscle spasm of back: Secondary | ICD-10-CM | POA: Diagnosis not present

## 2017-06-22 DIAGNOSIS — M9902 Segmental and somatic dysfunction of thoracic region: Secondary | ICD-10-CM | POA: Diagnosis not present

## 2017-06-22 DIAGNOSIS — M9903 Segmental and somatic dysfunction of lumbar region: Secondary | ICD-10-CM | POA: Diagnosis not present

## 2017-06-27 DIAGNOSIS — M62830 Muscle spasm of back: Secondary | ICD-10-CM | POA: Diagnosis not present

## 2017-06-27 DIAGNOSIS — M9902 Segmental and somatic dysfunction of thoracic region: Secondary | ICD-10-CM | POA: Diagnosis not present

## 2017-06-27 DIAGNOSIS — M9905 Segmental and somatic dysfunction of pelvic region: Secondary | ICD-10-CM | POA: Diagnosis not present

## 2017-06-27 DIAGNOSIS — M9903 Segmental and somatic dysfunction of lumbar region: Secondary | ICD-10-CM | POA: Diagnosis not present

## 2017-06-28 DIAGNOSIS — M545 Low back pain: Secondary | ICD-10-CM | POA: Diagnosis not present

## 2017-07-10 DIAGNOSIS — M545 Low back pain: Secondary | ICD-10-CM | POA: Diagnosis not present

## 2017-07-12 DIAGNOSIS — M545 Low back pain: Secondary | ICD-10-CM | POA: Diagnosis not present

## 2017-07-16 DIAGNOSIS — M62830 Muscle spasm of back: Secondary | ICD-10-CM | POA: Diagnosis not present

## 2017-07-16 DIAGNOSIS — M9903 Segmental and somatic dysfunction of lumbar region: Secondary | ICD-10-CM | POA: Diagnosis not present

## 2017-07-16 DIAGNOSIS — M9902 Segmental and somatic dysfunction of thoracic region: Secondary | ICD-10-CM | POA: Diagnosis not present

## 2017-07-16 DIAGNOSIS — M9905 Segmental and somatic dysfunction of pelvic region: Secondary | ICD-10-CM | POA: Diagnosis not present

## 2017-08-14 DIAGNOSIS — M545 Low back pain: Secondary | ICD-10-CM | POA: Diagnosis not present

## 2017-08-16 DIAGNOSIS — M9905 Segmental and somatic dysfunction of pelvic region: Secondary | ICD-10-CM | POA: Diagnosis not present

## 2017-08-16 DIAGNOSIS — M9902 Segmental and somatic dysfunction of thoracic region: Secondary | ICD-10-CM | POA: Diagnosis not present

## 2017-08-16 DIAGNOSIS — M9903 Segmental and somatic dysfunction of lumbar region: Secondary | ICD-10-CM | POA: Diagnosis not present

## 2017-08-16 DIAGNOSIS — M62830 Muscle spasm of back: Secondary | ICD-10-CM | POA: Diagnosis not present

## 2017-08-17 DIAGNOSIS — M545 Low back pain: Secondary | ICD-10-CM | POA: Diagnosis not present

## 2017-08-20 DIAGNOSIS — M545 Low back pain: Secondary | ICD-10-CM | POA: Diagnosis not present

## 2017-09-03 DIAGNOSIS — M545 Low back pain: Secondary | ICD-10-CM | POA: Diagnosis not present

## 2017-09-10 DIAGNOSIS — M545 Low back pain: Secondary | ICD-10-CM | POA: Diagnosis not present

## 2017-09-13 DIAGNOSIS — M545 Low back pain: Secondary | ICD-10-CM | POA: Diagnosis not present

## 2017-09-19 DIAGNOSIS — M545 Low back pain: Secondary | ICD-10-CM | POA: Diagnosis not present

## 2017-09-21 DIAGNOSIS — M545 Low back pain: Secondary | ICD-10-CM | POA: Diagnosis not present

## 2017-09-26 DIAGNOSIS — M545 Low back pain: Secondary | ICD-10-CM | POA: Diagnosis not present

## 2017-09-27 DIAGNOSIS — D0461 Carcinoma in situ of skin of right upper limb, including shoulder: Secondary | ICD-10-CM | POA: Diagnosis not present

## 2017-09-27 DIAGNOSIS — L720 Epidermal cyst: Secondary | ICD-10-CM | POA: Diagnosis not present

## 2017-10-16 DIAGNOSIS — L570 Actinic keratosis: Secondary | ICD-10-CM | POA: Diagnosis not present

## 2017-10-16 DIAGNOSIS — D225 Melanocytic nevi of trunk: Secondary | ICD-10-CM | POA: Diagnosis not present

## 2017-10-16 DIAGNOSIS — D485 Neoplasm of uncertain behavior of skin: Secondary | ICD-10-CM | POA: Diagnosis not present

## 2017-10-16 DIAGNOSIS — L814 Other melanin hyperpigmentation: Secondary | ICD-10-CM | POA: Diagnosis not present

## 2017-10-16 DIAGNOSIS — Z85828 Personal history of other malignant neoplasm of skin: Secondary | ICD-10-CM | POA: Diagnosis not present

## 2017-10-22 DIAGNOSIS — M545 Low back pain: Secondary | ICD-10-CM | POA: Diagnosis not present

## 2017-10-26 DIAGNOSIS — M545 Low back pain: Secondary | ICD-10-CM | POA: Diagnosis not present

## 2017-11-05 DIAGNOSIS — M545 Low back pain: Secondary | ICD-10-CM | POA: Diagnosis not present

## 2017-11-06 DIAGNOSIS — R300 Dysuria: Secondary | ICD-10-CM | POA: Diagnosis not present

## 2017-11-06 DIAGNOSIS — N390 Urinary tract infection, site not specified: Secondary | ICD-10-CM | POA: Diagnosis not present

## 2017-11-07 DIAGNOSIS — M545 Low back pain: Secondary | ICD-10-CM | POA: Diagnosis not present

## 2017-11-14 DIAGNOSIS — M545 Low back pain: Secondary | ICD-10-CM | POA: Diagnosis not present

## 2017-11-20 DIAGNOSIS — M62830 Muscle spasm of back: Secondary | ICD-10-CM | POA: Diagnosis not present

## 2017-11-20 DIAGNOSIS — M9903 Segmental and somatic dysfunction of lumbar region: Secondary | ICD-10-CM | POA: Diagnosis not present

## 2017-11-20 DIAGNOSIS — M9902 Segmental and somatic dysfunction of thoracic region: Secondary | ICD-10-CM | POA: Diagnosis not present

## 2017-11-20 DIAGNOSIS — M9905 Segmental and somatic dysfunction of pelvic region: Secondary | ICD-10-CM | POA: Diagnosis not present

## 2017-11-27 DIAGNOSIS — M9902 Segmental and somatic dysfunction of thoracic region: Secondary | ICD-10-CM | POA: Diagnosis not present

## 2017-11-27 DIAGNOSIS — M9905 Segmental and somatic dysfunction of pelvic region: Secondary | ICD-10-CM | POA: Diagnosis not present

## 2017-11-27 DIAGNOSIS — M9903 Segmental and somatic dysfunction of lumbar region: Secondary | ICD-10-CM | POA: Diagnosis not present

## 2017-11-27 DIAGNOSIS — M62830 Muscle spasm of back: Secondary | ICD-10-CM | POA: Diagnosis not present

## 2017-11-29 DIAGNOSIS — Z4802 Encounter for removal of sutures: Secondary | ICD-10-CM | POA: Diagnosis not present

## 2017-11-29 DIAGNOSIS — M545 Low back pain: Secondary | ICD-10-CM | POA: Diagnosis not present

## 2017-11-29 DIAGNOSIS — L988 Other specified disorders of the skin and subcutaneous tissue: Secondary | ICD-10-CM | POA: Diagnosis not present

## 2017-11-30 DIAGNOSIS — M62830 Muscle spasm of back: Secondary | ICD-10-CM | POA: Diagnosis not present

## 2017-11-30 DIAGNOSIS — M9905 Segmental and somatic dysfunction of pelvic region: Secondary | ICD-10-CM | POA: Diagnosis not present

## 2017-11-30 DIAGNOSIS — M9902 Segmental and somatic dysfunction of thoracic region: Secondary | ICD-10-CM | POA: Diagnosis not present

## 2017-11-30 DIAGNOSIS — M9903 Segmental and somatic dysfunction of lumbar region: Secondary | ICD-10-CM | POA: Diagnosis not present

## 2018-01-15 DIAGNOSIS — M545 Low back pain: Secondary | ICD-10-CM | POA: Diagnosis not present

## 2018-01-16 DIAGNOSIS — M9903 Segmental and somatic dysfunction of lumbar region: Secondary | ICD-10-CM | POA: Diagnosis not present

## 2018-01-16 DIAGNOSIS — M62830 Muscle spasm of back: Secondary | ICD-10-CM | POA: Diagnosis not present

## 2018-01-16 DIAGNOSIS — M9902 Segmental and somatic dysfunction of thoracic region: Secondary | ICD-10-CM | POA: Diagnosis not present

## 2018-01-16 DIAGNOSIS — M9905 Segmental and somatic dysfunction of pelvic region: Secondary | ICD-10-CM | POA: Diagnosis not present

## 2018-02-05 DIAGNOSIS — M62830 Muscle spasm of back: Secondary | ICD-10-CM | POA: Diagnosis not present

## 2018-02-05 DIAGNOSIS — M9905 Segmental and somatic dysfunction of pelvic region: Secondary | ICD-10-CM | POA: Diagnosis not present

## 2018-02-05 DIAGNOSIS — M9903 Segmental and somatic dysfunction of lumbar region: Secondary | ICD-10-CM | POA: Diagnosis not present

## 2018-02-05 DIAGNOSIS — M9902 Segmental and somatic dysfunction of thoracic region: Secondary | ICD-10-CM | POA: Diagnosis not present

## 2018-02-12 DIAGNOSIS — M9903 Segmental and somatic dysfunction of lumbar region: Secondary | ICD-10-CM | POA: Diagnosis not present

## 2018-02-12 DIAGNOSIS — M9905 Segmental and somatic dysfunction of pelvic region: Secondary | ICD-10-CM | POA: Diagnosis not present

## 2018-02-12 DIAGNOSIS — M9902 Segmental and somatic dysfunction of thoracic region: Secondary | ICD-10-CM | POA: Diagnosis not present

## 2018-02-12 DIAGNOSIS — M62830 Muscle spasm of back: Secondary | ICD-10-CM | POA: Diagnosis not present

## 2018-02-28 DIAGNOSIS — M62830 Muscle spasm of back: Secondary | ICD-10-CM | POA: Diagnosis not present

## 2018-02-28 DIAGNOSIS — M9902 Segmental and somatic dysfunction of thoracic region: Secondary | ICD-10-CM | POA: Diagnosis not present

## 2018-02-28 DIAGNOSIS — M9903 Segmental and somatic dysfunction of lumbar region: Secondary | ICD-10-CM | POA: Diagnosis not present

## 2018-02-28 DIAGNOSIS — M9905 Segmental and somatic dysfunction of pelvic region: Secondary | ICD-10-CM | POA: Diagnosis not present

## 2018-03-14 DIAGNOSIS — Z803 Family history of malignant neoplasm of breast: Secondary | ICD-10-CM | POA: Diagnosis not present

## 2018-03-14 DIAGNOSIS — Z1231 Encounter for screening mammogram for malignant neoplasm of breast: Secondary | ICD-10-CM | POA: Diagnosis not present

## 2018-04-15 DIAGNOSIS — Z6828 Body mass index (BMI) 28.0-28.9, adult: Secondary | ICD-10-CM | POA: Diagnosis not present

## 2018-04-15 DIAGNOSIS — Z01419 Encounter for gynecological examination (general) (routine) without abnormal findings: Secondary | ICD-10-CM | POA: Diagnosis not present

## 2018-04-15 DIAGNOSIS — Z13820 Encounter for screening for osteoporosis: Secondary | ICD-10-CM | POA: Diagnosis not present

## 2018-04-15 DIAGNOSIS — R309 Painful micturition, unspecified: Secondary | ICD-10-CM | POA: Diagnosis not present

## 2018-06-12 DIAGNOSIS — M9903 Segmental and somatic dysfunction of lumbar region: Secondary | ICD-10-CM | POA: Diagnosis not present

## 2018-06-12 DIAGNOSIS — M9905 Segmental and somatic dysfunction of pelvic region: Secondary | ICD-10-CM | POA: Diagnosis not present

## 2018-06-12 DIAGNOSIS — M9902 Segmental and somatic dysfunction of thoracic region: Secondary | ICD-10-CM | POA: Diagnosis not present

## 2018-06-12 DIAGNOSIS — M62830 Muscle spasm of back: Secondary | ICD-10-CM | POA: Diagnosis not present

## 2018-06-24 DIAGNOSIS — M62830 Muscle spasm of back: Secondary | ICD-10-CM | POA: Diagnosis not present

## 2018-06-24 DIAGNOSIS — M9902 Segmental and somatic dysfunction of thoracic region: Secondary | ICD-10-CM | POA: Diagnosis not present

## 2018-06-24 DIAGNOSIS — M9905 Segmental and somatic dysfunction of pelvic region: Secondary | ICD-10-CM | POA: Diagnosis not present

## 2018-06-24 DIAGNOSIS — M9903 Segmental and somatic dysfunction of lumbar region: Secondary | ICD-10-CM | POA: Diagnosis not present

## 2018-06-25 DIAGNOSIS — L570 Actinic keratosis: Secondary | ICD-10-CM | POA: Diagnosis not present

## 2018-06-25 DIAGNOSIS — Z85828 Personal history of other malignant neoplasm of skin: Secondary | ICD-10-CM | POA: Diagnosis not present

## 2018-06-25 DIAGNOSIS — L821 Other seborrheic keratosis: Secondary | ICD-10-CM | POA: Diagnosis not present

## 2018-07-08 DIAGNOSIS — M62830 Muscle spasm of back: Secondary | ICD-10-CM | POA: Diagnosis not present

## 2018-07-08 DIAGNOSIS — M9902 Segmental and somatic dysfunction of thoracic region: Secondary | ICD-10-CM | POA: Diagnosis not present

## 2018-07-08 DIAGNOSIS — M9903 Segmental and somatic dysfunction of lumbar region: Secondary | ICD-10-CM | POA: Diagnosis not present

## 2018-07-08 DIAGNOSIS — M9905 Segmental and somatic dysfunction of pelvic region: Secondary | ICD-10-CM | POA: Diagnosis not present

## 2018-07-21 DIAGNOSIS — N390 Urinary tract infection, site not specified: Secondary | ICD-10-CM | POA: Diagnosis not present

## 2018-12-26 DIAGNOSIS — M62830 Muscle spasm of back: Secondary | ICD-10-CM | POA: Diagnosis not present

## 2018-12-26 DIAGNOSIS — M9905 Segmental and somatic dysfunction of pelvic region: Secondary | ICD-10-CM | POA: Diagnosis not present

## 2018-12-26 DIAGNOSIS — M9902 Segmental and somatic dysfunction of thoracic region: Secondary | ICD-10-CM | POA: Diagnosis not present

## 2018-12-26 DIAGNOSIS — M9903 Segmental and somatic dysfunction of lumbar region: Secondary | ICD-10-CM | POA: Diagnosis not present

## 2019-01-21 DIAGNOSIS — D2261 Melanocytic nevi of right upper limb, including shoulder: Secondary | ICD-10-CM | POA: Diagnosis not present

## 2019-01-21 DIAGNOSIS — Z85828 Personal history of other malignant neoplasm of skin: Secondary | ICD-10-CM | POA: Diagnosis not present

## 2019-01-21 DIAGNOSIS — L720 Epidermal cyst: Secondary | ICD-10-CM | POA: Diagnosis not present

## 2019-01-21 DIAGNOSIS — L570 Actinic keratosis: Secondary | ICD-10-CM | POA: Diagnosis not present

## 2019-01-22 DIAGNOSIS — M9905 Segmental and somatic dysfunction of pelvic region: Secondary | ICD-10-CM | POA: Diagnosis not present

## 2019-01-22 DIAGNOSIS — M9902 Segmental and somatic dysfunction of thoracic region: Secondary | ICD-10-CM | POA: Diagnosis not present

## 2019-01-22 DIAGNOSIS — M9903 Segmental and somatic dysfunction of lumbar region: Secondary | ICD-10-CM | POA: Diagnosis not present

## 2019-01-22 DIAGNOSIS — M62830 Muscle spasm of back: Secondary | ICD-10-CM | POA: Diagnosis not present

## 2019-01-27 DIAGNOSIS — N390 Urinary tract infection, site not specified: Secondary | ICD-10-CM | POA: Diagnosis not present

## 2019-02-04 DIAGNOSIS — M9902 Segmental and somatic dysfunction of thoracic region: Secondary | ICD-10-CM | POA: Diagnosis not present

## 2019-02-04 DIAGNOSIS — M9905 Segmental and somatic dysfunction of pelvic region: Secondary | ICD-10-CM | POA: Diagnosis not present

## 2019-02-04 DIAGNOSIS — M62830 Muscle spasm of back: Secondary | ICD-10-CM | POA: Diagnosis not present

## 2019-02-04 DIAGNOSIS — M9903 Segmental and somatic dysfunction of lumbar region: Secondary | ICD-10-CM | POA: Diagnosis not present

## 2019-02-17 DIAGNOSIS — M9902 Segmental and somatic dysfunction of thoracic region: Secondary | ICD-10-CM | POA: Diagnosis not present

## 2019-02-17 DIAGNOSIS — M62830 Muscle spasm of back: Secondary | ICD-10-CM | POA: Diagnosis not present

## 2019-02-17 DIAGNOSIS — M9905 Segmental and somatic dysfunction of pelvic region: Secondary | ICD-10-CM | POA: Diagnosis not present

## 2019-02-17 DIAGNOSIS — M9903 Segmental and somatic dysfunction of lumbar region: Secondary | ICD-10-CM | POA: Diagnosis not present

## 2019-02-28 DIAGNOSIS — Z20828 Contact with and (suspected) exposure to other viral communicable diseases: Secondary | ICD-10-CM | POA: Diagnosis not present

## 2019-03-04 DIAGNOSIS — M9905 Segmental and somatic dysfunction of pelvic region: Secondary | ICD-10-CM | POA: Diagnosis not present

## 2019-03-04 DIAGNOSIS — M9903 Segmental and somatic dysfunction of lumbar region: Secondary | ICD-10-CM | POA: Diagnosis not present

## 2019-03-04 DIAGNOSIS — M62830 Muscle spasm of back: Secondary | ICD-10-CM | POA: Diagnosis not present

## 2019-03-04 DIAGNOSIS — M9902 Segmental and somatic dysfunction of thoracic region: Secondary | ICD-10-CM | POA: Diagnosis not present

## 2019-03-28 DIAGNOSIS — Z1231 Encounter for screening mammogram for malignant neoplasm of breast: Secondary | ICD-10-CM | POA: Diagnosis not present

## 2019-04-16 DIAGNOSIS — M9903 Segmental and somatic dysfunction of lumbar region: Secondary | ICD-10-CM | POA: Diagnosis not present

## 2019-04-16 DIAGNOSIS — M9902 Segmental and somatic dysfunction of thoracic region: Secondary | ICD-10-CM | POA: Diagnosis not present

## 2019-04-16 DIAGNOSIS — M62830 Muscle spasm of back: Secondary | ICD-10-CM | POA: Diagnosis not present

## 2019-04-16 DIAGNOSIS — M9905 Segmental and somatic dysfunction of pelvic region: Secondary | ICD-10-CM | POA: Diagnosis not present

## 2019-05-17 DIAGNOSIS — Z23 Encounter for immunization: Secondary | ICD-10-CM | POA: Diagnosis not present

## 2019-06-14 DIAGNOSIS — Z23 Encounter for immunization: Secondary | ICD-10-CM | POA: Diagnosis not present

## 2019-06-23 DIAGNOSIS — M62830 Muscle spasm of back: Secondary | ICD-10-CM | POA: Diagnosis not present

## 2019-06-23 DIAGNOSIS — M9905 Segmental and somatic dysfunction of pelvic region: Secondary | ICD-10-CM | POA: Diagnosis not present

## 2019-06-23 DIAGNOSIS — M9903 Segmental and somatic dysfunction of lumbar region: Secondary | ICD-10-CM | POA: Diagnosis not present

## 2019-06-23 DIAGNOSIS — M9902 Segmental and somatic dysfunction of thoracic region: Secondary | ICD-10-CM | POA: Diagnosis not present

## 2019-07-09 DIAGNOSIS — M9902 Segmental and somatic dysfunction of thoracic region: Secondary | ICD-10-CM | POA: Diagnosis not present

## 2019-07-09 DIAGNOSIS — M9905 Segmental and somatic dysfunction of pelvic region: Secondary | ICD-10-CM | POA: Diagnosis not present

## 2019-07-09 DIAGNOSIS — M62830 Muscle spasm of back: Secondary | ICD-10-CM | POA: Diagnosis not present

## 2019-07-09 DIAGNOSIS — M9903 Segmental and somatic dysfunction of lumbar region: Secondary | ICD-10-CM | POA: Diagnosis not present

## 2019-07-29 DIAGNOSIS — Z6829 Body mass index (BMI) 29.0-29.9, adult: Secondary | ICD-10-CM | POA: Diagnosis not present

## 2019-07-29 DIAGNOSIS — Z01419 Encounter for gynecological examination (general) (routine) without abnormal findings: Secondary | ICD-10-CM | POA: Diagnosis not present

## 2019-08-18 DIAGNOSIS — L570 Actinic keratosis: Secondary | ICD-10-CM | POA: Diagnosis not present

## 2019-08-18 DIAGNOSIS — Z85828 Personal history of other malignant neoplasm of skin: Secondary | ICD-10-CM | POA: Diagnosis not present

## 2019-08-18 DIAGNOSIS — D485 Neoplasm of uncertain behavior of skin: Secondary | ICD-10-CM | POA: Diagnosis not present

## 2019-08-18 DIAGNOSIS — L821 Other seborrheic keratosis: Secondary | ICD-10-CM | POA: Diagnosis not present

## 2019-08-18 DIAGNOSIS — L814 Other melanin hyperpigmentation: Secondary | ICD-10-CM | POA: Diagnosis not present

## 2019-08-18 DIAGNOSIS — L820 Inflamed seborrheic keratosis: Secondary | ICD-10-CM | POA: Diagnosis not present

## 2019-08-18 DIAGNOSIS — R233 Spontaneous ecchymoses: Secondary | ICD-10-CM | POA: Diagnosis not present

## 2019-10-28 DIAGNOSIS — M9905 Segmental and somatic dysfunction of pelvic region: Secondary | ICD-10-CM | POA: Diagnosis not present

## 2019-10-28 DIAGNOSIS — M9902 Segmental and somatic dysfunction of thoracic region: Secondary | ICD-10-CM | POA: Diagnosis not present

## 2019-10-28 DIAGNOSIS — M9903 Segmental and somatic dysfunction of lumbar region: Secondary | ICD-10-CM | POA: Diagnosis not present

## 2019-10-28 DIAGNOSIS — M62830 Muscle spasm of back: Secondary | ICD-10-CM | POA: Diagnosis not present

## 2019-11-12 DIAGNOSIS — M9905 Segmental and somatic dysfunction of pelvic region: Secondary | ICD-10-CM | POA: Diagnosis not present

## 2019-11-12 DIAGNOSIS — M62830 Muscle spasm of back: Secondary | ICD-10-CM | POA: Diagnosis not present

## 2019-11-12 DIAGNOSIS — M9903 Segmental and somatic dysfunction of lumbar region: Secondary | ICD-10-CM | POA: Diagnosis not present

## 2019-11-12 DIAGNOSIS — M9902 Segmental and somatic dysfunction of thoracic region: Secondary | ICD-10-CM | POA: Diagnosis not present

## 2019-11-13 DIAGNOSIS — R109 Unspecified abdominal pain: Secondary | ICD-10-CM | POA: Diagnosis not present

## 2019-11-17 ENCOUNTER — Other Ambulatory Visit (HOSPITAL_BASED_OUTPATIENT_CLINIC_OR_DEPARTMENT_OTHER): Payer: Self-pay | Admitting: Family Medicine

## 2019-11-17 DIAGNOSIS — R1010 Upper abdominal pain, unspecified: Secondary | ICD-10-CM

## 2019-11-19 ENCOUNTER — Other Ambulatory Visit: Payer: Self-pay

## 2019-11-19 ENCOUNTER — Ambulatory Visit (HOSPITAL_BASED_OUTPATIENT_CLINIC_OR_DEPARTMENT_OTHER)
Admission: RE | Admit: 2019-11-19 | Discharge: 2019-11-19 | Disposition: A | Discharge status: Home / Self Care | Payer: BC Managed Care – PPO | Source: Ambulatory Visit | Attending: Family Medicine | Admitting: Family Medicine

## 2019-11-19 DIAGNOSIS — R1010 Upper abdominal pain, unspecified: Secondary | ICD-10-CM | POA: Diagnosis not present

## 2019-11-19 DIAGNOSIS — R109 Unspecified abdominal pain: Secondary | ICD-10-CM | POA: Diagnosis not present

## 2020-01-05 DIAGNOSIS — N390 Urinary tract infection, site not specified: Secondary | ICD-10-CM | POA: Diagnosis not present

## 2020-02-13 ENCOUNTER — Ambulatory Visit: Payer: BC Managed Care – PPO | Attending: Internal Medicine

## 2020-02-13 DIAGNOSIS — Z23 Encounter for immunization: Secondary | ICD-10-CM

## 2020-02-13 NOTE — Progress Notes (Signed)
   Covid-19 Vaccination Clinic  Name:  Jocelyn Gutierrez    MRN: [id number] DOB: 1960/10/23  02/13/2020  Ms. Vessels was observed post Covid-19 immunization for 15 minutes without incident. She was provided with Vaccine Information Sheet and instruction to access the V-Safe system.   Ms. Date was instructed to call 911 with any severe reactions post vaccine: Marland Kitchen Difficulty breathing  . Swelling of face and throat  . A fast heartbeat  . A bad rash all over body  . Dizziness and weakness   Immunizations Administered    Name Date Dose VIS Date Route   Moderna Covid-19 Booster Vaccine 02/13/2020  2:10 PM 0.25 mL 12/17/2019 Intramuscular   Manufacturer: Moderna   Lot: [id number]   NDC: 36144-315-40

## 2020-02-18 DIAGNOSIS — L814 Other melanin hyperpigmentation: Secondary | ICD-10-CM | POA: Diagnosis not present

## 2020-02-18 DIAGNOSIS — Z85828 Personal history of other malignant neoplasm of skin: Secondary | ICD-10-CM | POA: Diagnosis not present

## 2020-02-18 DIAGNOSIS — L720 Epidermal cyst: Secondary | ICD-10-CM | POA: Diagnosis not present

## 2020-02-18 DIAGNOSIS — L570 Actinic keratosis: Secondary | ICD-10-CM | POA: Diagnosis not present

## 2020-02-18 DIAGNOSIS — D2271 Melanocytic nevi of right lower limb, including hip: Secondary | ICD-10-CM | POA: Diagnosis not present

## 2020-03-30 DIAGNOSIS — M9902 Segmental and somatic dysfunction of thoracic region: Secondary | ICD-10-CM | POA: Diagnosis not present

## 2020-03-30 DIAGNOSIS — M9905 Segmental and somatic dysfunction of pelvic region: Secondary | ICD-10-CM | POA: Diagnosis not present

## 2020-03-30 DIAGNOSIS — M62830 Muscle spasm of back: Secondary | ICD-10-CM | POA: Diagnosis not present

## 2020-03-30 DIAGNOSIS — M9903 Segmental and somatic dysfunction of lumbar region: Secondary | ICD-10-CM | POA: Diagnosis not present

## 2020-04-01 DIAGNOSIS — M62830 Muscle spasm of back: Secondary | ICD-10-CM | POA: Diagnosis not present

## 2020-04-01 DIAGNOSIS — M9905 Segmental and somatic dysfunction of pelvic region: Secondary | ICD-10-CM | POA: Diagnosis not present

## 2020-04-01 DIAGNOSIS — M9902 Segmental and somatic dysfunction of thoracic region: Secondary | ICD-10-CM | POA: Diagnosis not present

## 2020-04-01 DIAGNOSIS — M9903 Segmental and somatic dysfunction of lumbar region: Secondary | ICD-10-CM | POA: Diagnosis not present

## 2020-04-05 DIAGNOSIS — M9903 Segmental and somatic dysfunction of lumbar region: Secondary | ICD-10-CM | POA: Diagnosis not present

## 2020-04-05 DIAGNOSIS — M9902 Segmental and somatic dysfunction of thoracic region: Secondary | ICD-10-CM | POA: Diagnosis not present

## 2020-04-05 DIAGNOSIS — M62830 Muscle spasm of back: Secondary | ICD-10-CM | POA: Diagnosis not present

## 2020-04-05 DIAGNOSIS — M9905 Segmental and somatic dysfunction of pelvic region: Secondary | ICD-10-CM | POA: Diagnosis not present

## 2020-04-12 DIAGNOSIS — M9905 Segmental and somatic dysfunction of pelvic region: Secondary | ICD-10-CM | POA: Diagnosis not present

## 2020-04-12 DIAGNOSIS — M9903 Segmental and somatic dysfunction of lumbar region: Secondary | ICD-10-CM | POA: Diagnosis not present

## 2020-04-12 DIAGNOSIS — M62830 Muscle spasm of back: Secondary | ICD-10-CM | POA: Diagnosis not present

## 2020-04-12 DIAGNOSIS — M9902 Segmental and somatic dysfunction of thoracic region: Secondary | ICD-10-CM | POA: Diagnosis not present

## 2020-04-14 DIAGNOSIS — M9903 Segmental and somatic dysfunction of lumbar region: Secondary | ICD-10-CM | POA: Diagnosis not present

## 2020-04-14 DIAGNOSIS — M9902 Segmental and somatic dysfunction of thoracic region: Secondary | ICD-10-CM | POA: Diagnosis not present

## 2020-04-14 DIAGNOSIS — M62830 Muscle spasm of back: Secondary | ICD-10-CM | POA: Diagnosis not present

## 2020-04-14 DIAGNOSIS — M9905 Segmental and somatic dysfunction of pelvic region: Secondary | ICD-10-CM | POA: Diagnosis not present

## 2020-04-15 DIAGNOSIS — M62830 Muscle spasm of back: Secondary | ICD-10-CM | POA: Diagnosis not present

## 2020-04-15 DIAGNOSIS — M9905 Segmental and somatic dysfunction of pelvic region: Secondary | ICD-10-CM | POA: Diagnosis not present

## 2020-04-15 DIAGNOSIS — M9903 Segmental and somatic dysfunction of lumbar region: Secondary | ICD-10-CM | POA: Diagnosis not present

## 2020-04-15 DIAGNOSIS — M9902 Segmental and somatic dysfunction of thoracic region: Secondary | ICD-10-CM | POA: Diagnosis not present

## 2020-04-15 DIAGNOSIS — Z1231 Encounter for screening mammogram for malignant neoplasm of breast: Secondary | ICD-10-CM | POA: Diagnosis not present

## 2020-05-03 DIAGNOSIS — M9905 Segmental and somatic dysfunction of pelvic region: Secondary | ICD-10-CM | POA: Diagnosis not present

## 2020-05-03 DIAGNOSIS — M9902 Segmental and somatic dysfunction of thoracic region: Secondary | ICD-10-CM | POA: Diagnosis not present

## 2020-05-03 DIAGNOSIS — M9903 Segmental and somatic dysfunction of lumbar region: Secondary | ICD-10-CM | POA: Diagnosis not present

## 2020-05-03 DIAGNOSIS — M62830 Muscle spasm of back: Secondary | ICD-10-CM | POA: Diagnosis not present

## 2020-05-06 DIAGNOSIS — M9905 Segmental and somatic dysfunction of pelvic region: Secondary | ICD-10-CM | POA: Diagnosis not present

## 2020-05-06 DIAGNOSIS — M62830 Muscle spasm of back: Secondary | ICD-10-CM | POA: Diagnosis not present

## 2020-05-06 DIAGNOSIS — M9903 Segmental and somatic dysfunction of lumbar region: Secondary | ICD-10-CM | POA: Diagnosis not present

## 2020-05-06 DIAGNOSIS — M9902 Segmental and somatic dysfunction of thoracic region: Secondary | ICD-10-CM | POA: Diagnosis not present

## 2020-05-10 DIAGNOSIS — M9903 Segmental and somatic dysfunction of lumbar region: Secondary | ICD-10-CM | POA: Diagnosis not present

## 2020-05-10 DIAGNOSIS — M9905 Segmental and somatic dysfunction of pelvic region: Secondary | ICD-10-CM | POA: Diagnosis not present

## 2020-05-10 DIAGNOSIS — M9902 Segmental and somatic dysfunction of thoracic region: Secondary | ICD-10-CM | POA: Diagnosis not present

## 2020-05-10 DIAGNOSIS — M62830 Muscle spasm of back: Secondary | ICD-10-CM | POA: Diagnosis not present

## 2020-05-20 DIAGNOSIS — M9902 Segmental and somatic dysfunction of thoracic region: Secondary | ICD-10-CM | POA: Diagnosis not present

## 2020-05-20 DIAGNOSIS — M9905 Segmental and somatic dysfunction of pelvic region: Secondary | ICD-10-CM | POA: Diagnosis not present

## 2020-05-20 DIAGNOSIS — H524 Presbyopia: Secondary | ICD-10-CM | POA: Diagnosis not present

## 2020-05-20 DIAGNOSIS — H2513 Age-related nuclear cataract, bilateral: Secondary | ICD-10-CM | POA: Diagnosis not present

## 2020-05-20 DIAGNOSIS — M62830 Muscle spasm of back: Secondary | ICD-10-CM | POA: Diagnosis not present

## 2020-05-20 DIAGNOSIS — M9903 Segmental and somatic dysfunction of lumbar region: Secondary | ICD-10-CM | POA: Diagnosis not present

## 2020-05-20 DIAGNOSIS — H5203 Hypermetropia, bilateral: Secondary | ICD-10-CM | POA: Diagnosis not present

## 2020-05-31 DIAGNOSIS — M9902 Segmental and somatic dysfunction of thoracic region: Secondary | ICD-10-CM | POA: Diagnosis not present

## 2020-05-31 DIAGNOSIS — M9905 Segmental and somatic dysfunction of pelvic region: Secondary | ICD-10-CM | POA: Diagnosis not present

## 2020-05-31 DIAGNOSIS — M62830 Muscle spasm of back: Secondary | ICD-10-CM | POA: Diagnosis not present

## 2020-05-31 DIAGNOSIS — M9903 Segmental and somatic dysfunction of lumbar region: Secondary | ICD-10-CM | POA: Diagnosis not present

## 2020-06-03 DIAGNOSIS — M9902 Segmental and somatic dysfunction of thoracic region: Secondary | ICD-10-CM | POA: Diagnosis not present

## 2020-06-03 DIAGNOSIS — M9903 Segmental and somatic dysfunction of lumbar region: Secondary | ICD-10-CM | POA: Diagnosis not present

## 2020-06-03 DIAGNOSIS — M9905 Segmental and somatic dysfunction of pelvic region: Secondary | ICD-10-CM | POA: Diagnosis not present

## 2020-06-03 DIAGNOSIS — M62830 Muscle spasm of back: Secondary | ICD-10-CM | POA: Diagnosis not present

## 2020-06-29 DIAGNOSIS — M9903 Segmental and somatic dysfunction of lumbar region: Secondary | ICD-10-CM | POA: Diagnosis not present

## 2020-06-29 DIAGNOSIS — M9905 Segmental and somatic dysfunction of pelvic region: Secondary | ICD-10-CM | POA: Diagnosis not present

## 2020-06-29 DIAGNOSIS — M9902 Segmental and somatic dysfunction of thoracic region: Secondary | ICD-10-CM | POA: Diagnosis not present

## 2020-06-29 DIAGNOSIS — M62830 Muscle spasm of back: Secondary | ICD-10-CM | POA: Diagnosis not present

## 2020-07-30 DIAGNOSIS — M9905 Segmental and somatic dysfunction of pelvic region: Secondary | ICD-10-CM | POA: Diagnosis not present

## 2020-07-30 DIAGNOSIS — M9903 Segmental and somatic dysfunction of lumbar region: Secondary | ICD-10-CM | POA: Diagnosis not present

## 2020-07-30 DIAGNOSIS — M62830 Muscle spasm of back: Secondary | ICD-10-CM | POA: Diagnosis not present

## 2020-07-30 DIAGNOSIS — M9902 Segmental and somatic dysfunction of thoracic region: Secondary | ICD-10-CM | POA: Diagnosis not present

## 2020-07-30 DIAGNOSIS — M791 Myalgia, unspecified site: Secondary | ICD-10-CM | POA: Diagnosis not present

## 2020-08-02 DIAGNOSIS — Z01419 Encounter for gynecological examination (general) (routine) without abnormal findings: Secondary | ICD-10-CM | POA: Diagnosis not present

## 2020-08-02 DIAGNOSIS — Z6828 Body mass index (BMI) 28.0-28.9, adult: Secondary | ICD-10-CM | POA: Diagnosis not present

## 2020-08-11 DIAGNOSIS — Z20822 Contact with and (suspected) exposure to covid-19: Secondary | ICD-10-CM | POA: Diagnosis not present

## 2020-08-18 DIAGNOSIS — L570 Actinic keratosis: Secondary | ICD-10-CM | POA: Diagnosis not present

## 2020-08-18 DIAGNOSIS — D225 Melanocytic nevi of trunk: Secondary | ICD-10-CM | POA: Diagnosis not present

## 2020-08-18 DIAGNOSIS — L814 Other melanin hyperpigmentation: Secondary | ICD-10-CM | POA: Diagnosis not present

## 2020-08-18 DIAGNOSIS — Z85828 Personal history of other malignant neoplasm of skin: Secondary | ICD-10-CM | POA: Diagnosis not present

## 2020-08-24 DIAGNOSIS — M9903 Segmental and somatic dysfunction of lumbar region: Secondary | ICD-10-CM | POA: Diagnosis not present

## 2020-08-24 DIAGNOSIS — M9905 Segmental and somatic dysfunction of pelvic region: Secondary | ICD-10-CM | POA: Diagnosis not present

## 2020-08-24 DIAGNOSIS — M9902 Segmental and somatic dysfunction of thoracic region: Secondary | ICD-10-CM | POA: Diagnosis not present

## 2020-08-24 DIAGNOSIS — M62838 Other muscle spasm: Secondary | ICD-10-CM | POA: Diagnosis not present

## 2020-11-11 DIAGNOSIS — M9903 Segmental and somatic dysfunction of lumbar region: Secondary | ICD-10-CM | POA: Diagnosis not present

## 2020-11-11 DIAGNOSIS — M62838 Other muscle spasm: Secondary | ICD-10-CM | POA: Diagnosis not present

## 2020-11-11 DIAGNOSIS — M9902 Segmental and somatic dysfunction of thoracic region: Secondary | ICD-10-CM | POA: Diagnosis not present

## 2020-11-11 DIAGNOSIS — M9905 Segmental and somatic dysfunction of pelvic region: Secondary | ICD-10-CM | POA: Diagnosis not present

## 2020-11-30 DIAGNOSIS — Z13220 Encounter for screening for lipoid disorders: Secondary | ICD-10-CM | POA: Diagnosis not present

## 2020-11-30 DIAGNOSIS — Z13820 Encounter for screening for osteoporosis: Secondary | ICD-10-CM | POA: Diagnosis not present

## 2020-11-30 DIAGNOSIS — Z13228 Encounter for screening for other metabolic disorders: Secondary | ICD-10-CM | POA: Diagnosis not present

## 2020-12-16 DIAGNOSIS — M9902 Segmental and somatic dysfunction of thoracic region: Secondary | ICD-10-CM | POA: Diagnosis not present

## 2020-12-16 DIAGNOSIS — M9903 Segmental and somatic dysfunction of lumbar region: Secondary | ICD-10-CM | POA: Diagnosis not present

## 2020-12-16 DIAGNOSIS — M9905 Segmental and somatic dysfunction of pelvic region: Secondary | ICD-10-CM | POA: Diagnosis not present

## 2020-12-16 DIAGNOSIS — M62830 Muscle spasm of back: Secondary | ICD-10-CM | POA: Diagnosis not present

## 2020-12-30 DIAGNOSIS — M9902 Segmental and somatic dysfunction of thoracic region: Secondary | ICD-10-CM | POA: Diagnosis not present

## 2020-12-30 DIAGNOSIS — M5441 Lumbago with sciatica, right side: Secondary | ICD-10-CM | POA: Diagnosis not present

## 2020-12-30 DIAGNOSIS — M9905 Segmental and somatic dysfunction of pelvic region: Secondary | ICD-10-CM | POA: Diagnosis not present

## 2020-12-30 DIAGNOSIS — M9903 Segmental and somatic dysfunction of lumbar region: Secondary | ICD-10-CM | POA: Diagnosis not present

## 2021-01-18 DIAGNOSIS — M9902 Segmental and somatic dysfunction of thoracic region: Secondary | ICD-10-CM | POA: Diagnosis not present

## 2021-01-18 DIAGNOSIS — M9905 Segmental and somatic dysfunction of pelvic region: Secondary | ICD-10-CM | POA: Diagnosis not present

## 2021-01-18 DIAGNOSIS — M9903 Segmental and somatic dysfunction of lumbar region: Secondary | ICD-10-CM | POA: Diagnosis not present

## 2021-01-18 DIAGNOSIS — M7542 Impingement syndrome of left shoulder: Secondary | ICD-10-CM | POA: Diagnosis not present

## 2021-02-17 DIAGNOSIS — L820 Inflamed seborrheic keratosis: Secondary | ICD-10-CM | POA: Diagnosis not present

## 2021-02-17 DIAGNOSIS — L821 Other seborrheic keratosis: Secondary | ICD-10-CM | POA: Diagnosis not present

## 2021-02-17 DIAGNOSIS — Z85828 Personal history of other malignant neoplasm of skin: Secondary | ICD-10-CM | POA: Diagnosis not present

## 2021-02-17 DIAGNOSIS — C44529 Squamous cell carcinoma of skin of other part of trunk: Secondary | ICD-10-CM | POA: Diagnosis not present

## 2021-02-17 DIAGNOSIS — L570 Actinic keratosis: Secondary | ICD-10-CM | POA: Diagnosis not present

## 2021-04-05 DIAGNOSIS — M9903 Segmental and somatic dysfunction of lumbar region: Secondary | ICD-10-CM | POA: Diagnosis not present

## 2021-04-05 DIAGNOSIS — M9905 Segmental and somatic dysfunction of pelvic region: Secondary | ICD-10-CM | POA: Diagnosis not present

## 2021-04-05 DIAGNOSIS — M7061 Trochanteric bursitis, right hip: Secondary | ICD-10-CM | POA: Diagnosis not present

## 2021-04-05 DIAGNOSIS — M9902 Segmental and somatic dysfunction of thoracic region: Secondary | ICD-10-CM | POA: Diagnosis not present

## 2021-04-05 DIAGNOSIS — M7542 Impingement syndrome of left shoulder: Secondary | ICD-10-CM | POA: Diagnosis not present

## 2021-05-03 DIAGNOSIS — M9902 Segmental and somatic dysfunction of thoracic region: Secondary | ICD-10-CM | POA: Diagnosis not present

## 2021-05-03 DIAGNOSIS — M9903 Segmental and somatic dysfunction of lumbar region: Secondary | ICD-10-CM | POA: Diagnosis not present

## 2021-05-03 DIAGNOSIS — M7061 Trochanteric bursitis, right hip: Secondary | ICD-10-CM | POA: Diagnosis not present

## 2021-05-03 DIAGNOSIS — M9905 Segmental and somatic dysfunction of pelvic region: Secondary | ICD-10-CM | POA: Diagnosis not present

## 2021-05-03 DIAGNOSIS — M7542 Impingement syndrome of left shoulder: Secondary | ICD-10-CM | POA: Diagnosis not present

## 2021-05-05 DIAGNOSIS — Z1231 Encounter for screening mammogram for malignant neoplasm of breast: Secondary | ICD-10-CM | POA: Diagnosis not present

## 2021-05-06 DIAGNOSIS — M9902 Segmental and somatic dysfunction of thoracic region: Secondary | ICD-10-CM | POA: Diagnosis not present

## 2021-05-06 DIAGNOSIS — M9905 Segmental and somatic dysfunction of pelvic region: Secondary | ICD-10-CM | POA: Diagnosis not present

## 2021-05-06 DIAGNOSIS — M7542 Impingement syndrome of left shoulder: Secondary | ICD-10-CM | POA: Diagnosis not present

## 2021-05-06 DIAGNOSIS — M9903 Segmental and somatic dysfunction of lumbar region: Secondary | ICD-10-CM | POA: Diagnosis not present

## 2021-05-06 DIAGNOSIS — M7061 Trochanteric bursitis, right hip: Secondary | ICD-10-CM | POA: Diagnosis not present

## 2021-05-23 DIAGNOSIS — M9903 Segmental and somatic dysfunction of lumbar region: Secondary | ICD-10-CM | POA: Diagnosis not present

## 2021-05-23 DIAGNOSIS — M9902 Segmental and somatic dysfunction of thoracic region: Secondary | ICD-10-CM | POA: Diagnosis not present

## 2021-05-23 DIAGNOSIS — M7542 Impingement syndrome of left shoulder: Secondary | ICD-10-CM | POA: Diagnosis not present

## 2021-05-23 DIAGNOSIS — M7061 Trochanteric bursitis, right hip: Secondary | ICD-10-CM | POA: Diagnosis not present

## 2021-05-23 DIAGNOSIS — M9905 Segmental and somatic dysfunction of pelvic region: Secondary | ICD-10-CM | POA: Diagnosis not present

## 2021-07-16 IMAGING — US US ABDOMEN COMPLETE
2 series · 14 of 25 positions shown · non-contrast
Comparison: October 28, 2004

CLINICAL DATA: Abdomen pain for 2 days.

EXAM:
ABDOMEN ULTRASOUND COMPLETE

[Series 1: us abdomen complete · 13 of 60 slices shown (1 of 2)]
[im 1/60]
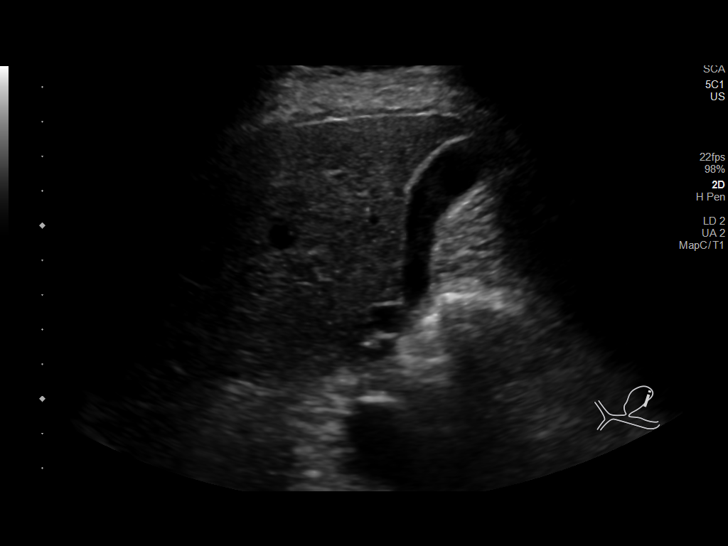
[im 6/60]
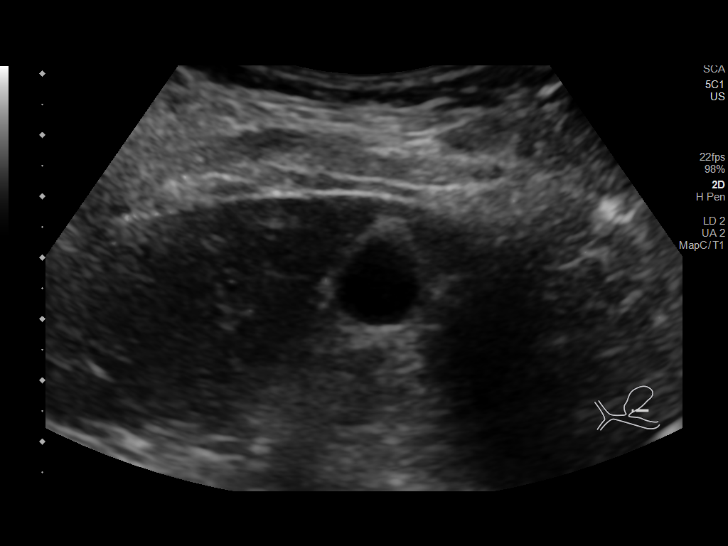
[im 11/60]
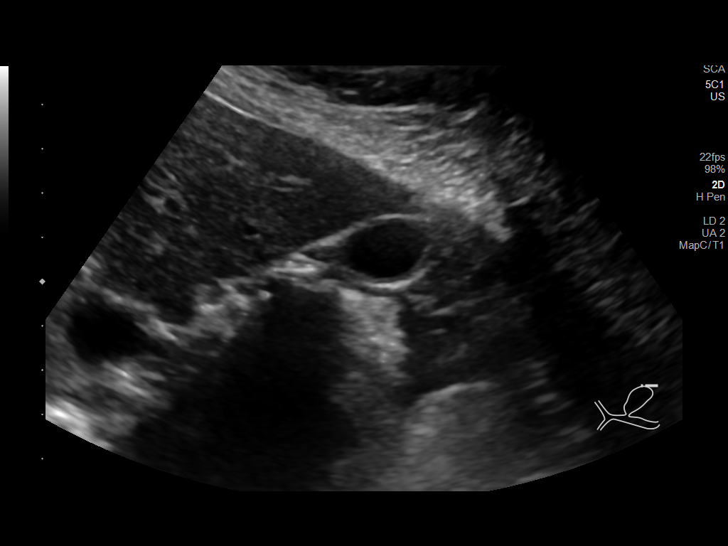
[im 16/60]
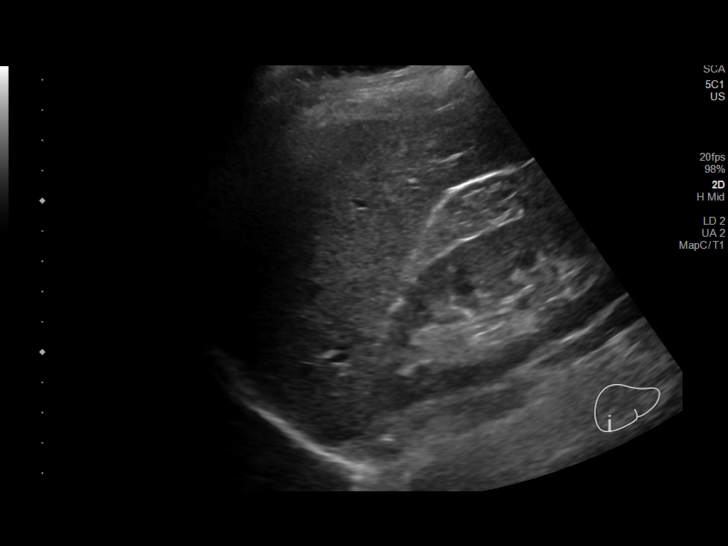
[im 21/60]
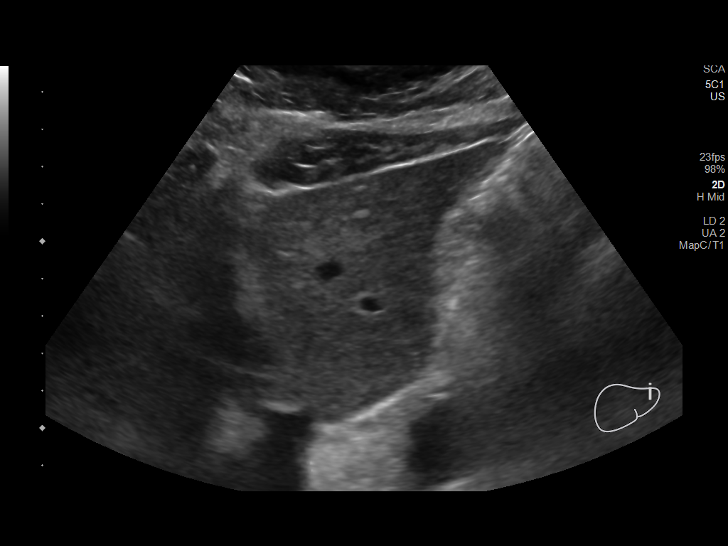
[im 24/60]
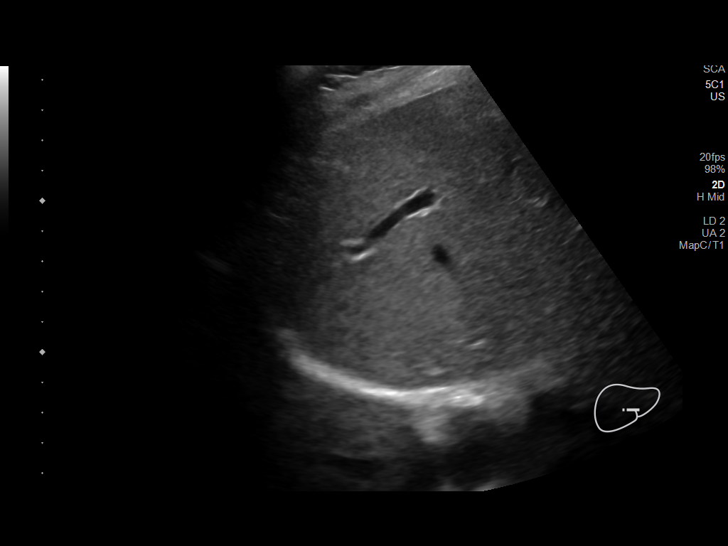
[im 29/60]
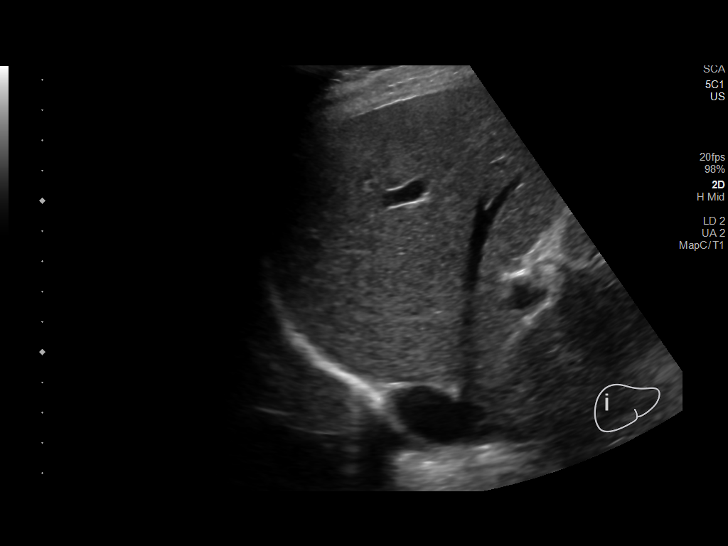
[im 34/60]
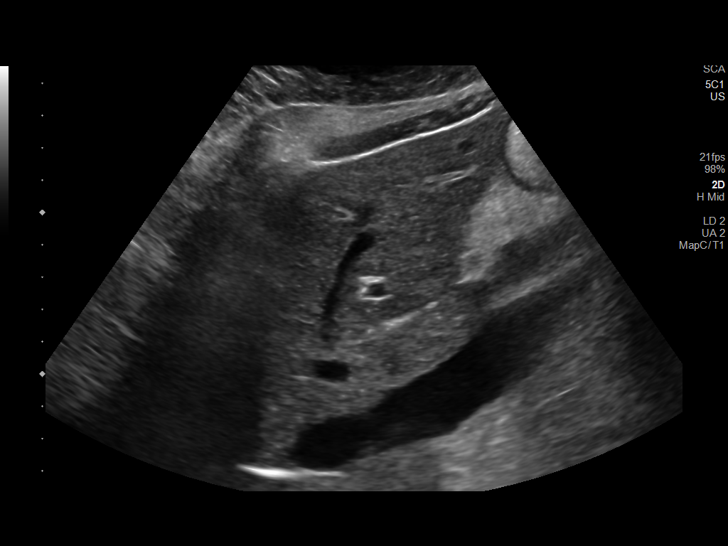
[im 39/60]
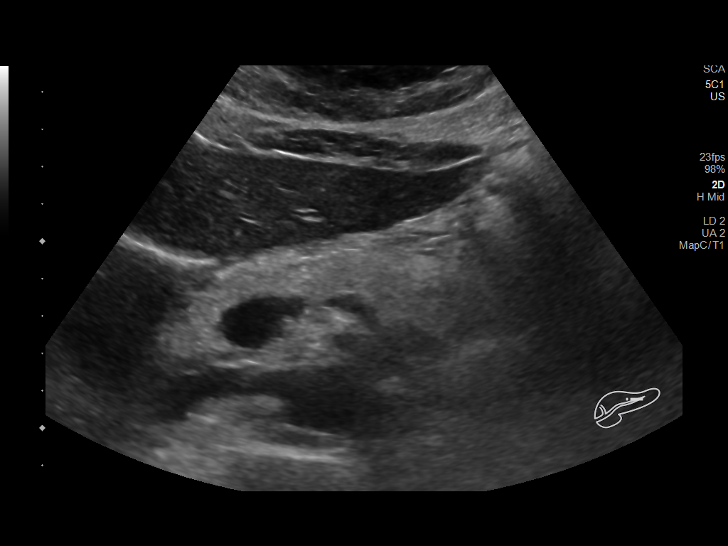
[im 42/60]
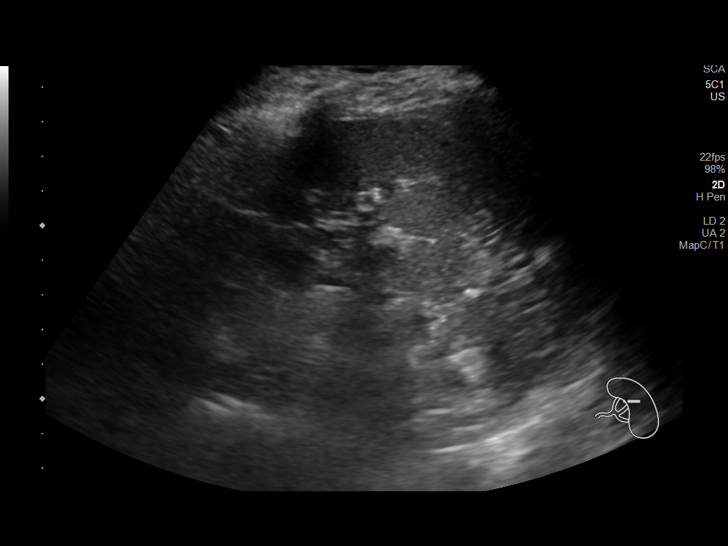
[im 47/60]
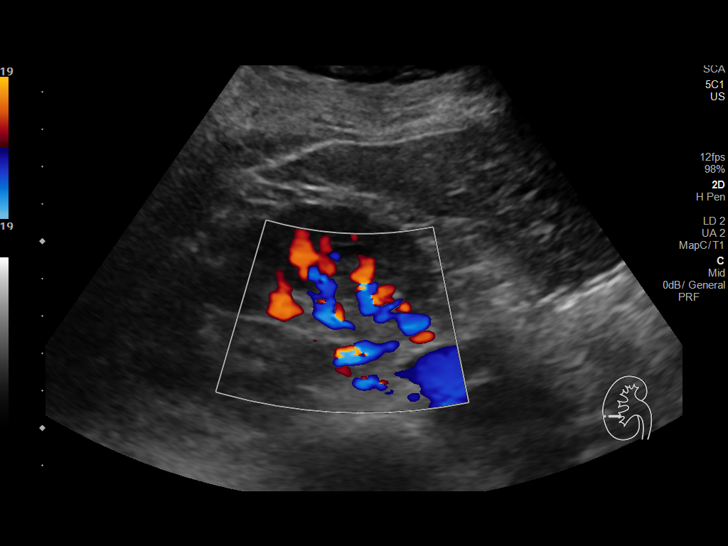
[im 52/60]
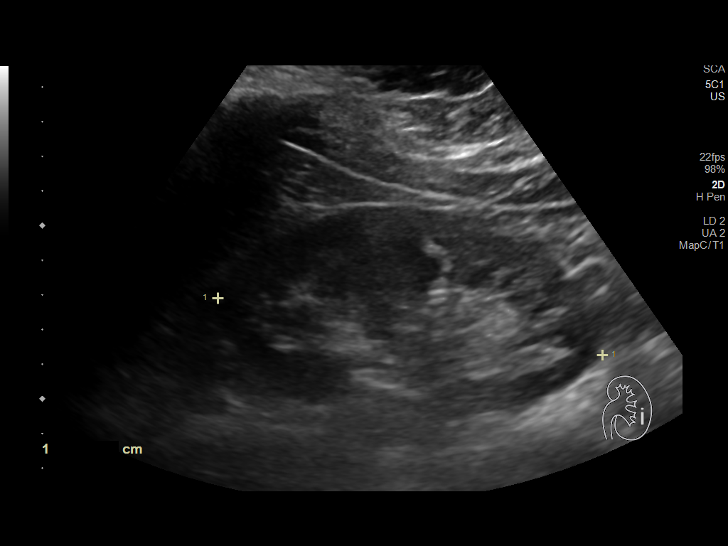
[im 57/60]
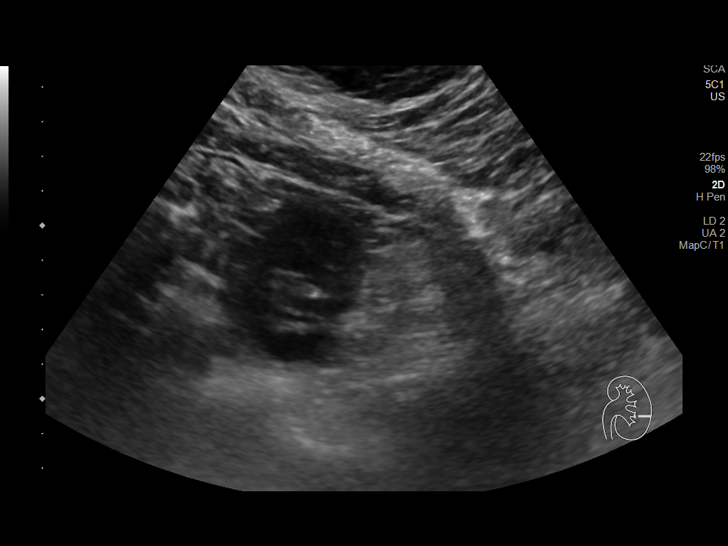

[Series 1001: us abdomen complete · 1 of 3 slices shown (2 of 2)]
[im 1/3]
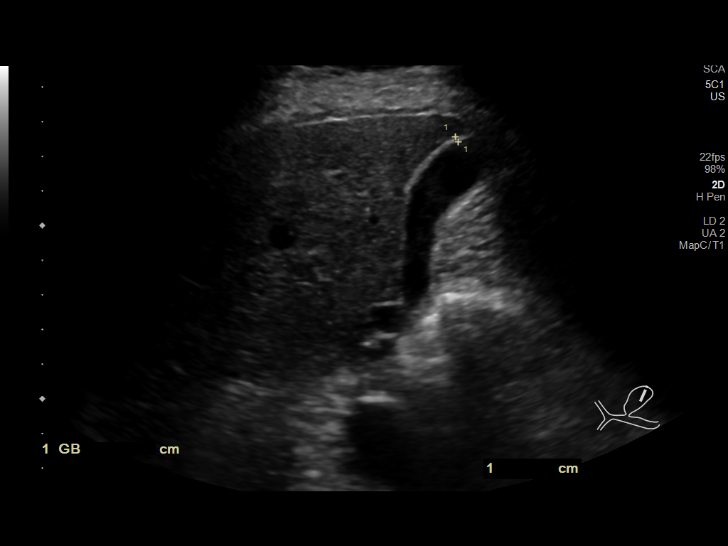

[14 of 25 positions shown; findings below may reference images not displayed]

FINDINGS: Gallbladder: No gallstones or wall thickening visualized. No
sonographic Murphy sign noted by sonographer.

Common bile duct: Diameter: 3.2 mm

Liver: No focal lesion identified. Within normal limits in
parenchymal echogenicity. Portal vein is patent on color Doppler
imaging with normal direction of blood flow towards the liver.

IVC: No abnormality visualized.

Pancreas: Visualized portion unremarkable.

Spleen: Size and appearance within normal limits.

Right Kidney: Length: 11 cm. Echogenicity within normal limits. No
mass or hydronephrosis visualized.

Left Kidney: Length: 11.2 cm. Echogenicity within normal limits. No
mass or hydronephrosis visualized.

Abdominal aorta: No aneurysm visualized.

Other findings: None.
IMPRESSION: Normal abdomen ultrasound.

## 2021-08-04 DIAGNOSIS — Z6829 Body mass index (BMI) 29.0-29.9, adult: Secondary | ICD-10-CM | POA: Diagnosis not present

## 2021-08-04 DIAGNOSIS — Z01419 Encounter for gynecological examination (general) (routine) without abnormal findings: Secondary | ICD-10-CM | POA: Diagnosis not present

## 2021-08-04 DIAGNOSIS — Z131 Encounter for screening for diabetes mellitus: Secondary | ICD-10-CM | POA: Diagnosis not present

## 2021-08-04 DIAGNOSIS — Z13228 Encounter for screening for other metabolic disorders: Secondary | ICD-10-CM | POA: Diagnosis not present

## 2021-08-04 DIAGNOSIS — Z13220 Encounter for screening for lipoid disorders: Secondary | ICD-10-CM | POA: Diagnosis not present

## 2021-08-04 DIAGNOSIS — Z124 Encounter for screening for malignant neoplasm of cervix: Secondary | ICD-10-CM | POA: Diagnosis not present

## 2021-08-04 DIAGNOSIS — Z1151 Encounter for screening for human papillomavirus (HPV): Secondary | ICD-10-CM | POA: Diagnosis not present

## 2021-08-24 DIAGNOSIS — Z85828 Personal history of other malignant neoplasm of skin: Secondary | ICD-10-CM | POA: Diagnosis not present

## 2021-08-24 DIAGNOSIS — L570 Actinic keratosis: Secondary | ICD-10-CM | POA: Diagnosis not present

## 2021-08-24 DIAGNOSIS — Z800 Family history of malignant neoplasm of digestive organs: Secondary | ICD-10-CM | POA: Diagnosis not present

## 2021-08-24 DIAGNOSIS — L821 Other seborrheic keratosis: Secondary | ICD-10-CM | POA: Diagnosis not present

## 2021-08-24 DIAGNOSIS — K5730 Diverticulosis of large intestine without perforation or abscess without bleeding: Secondary | ICD-10-CM | POA: Diagnosis not present

## 2021-08-24 DIAGNOSIS — L918 Other hypertrophic disorders of the skin: Secondary | ICD-10-CM | POA: Diagnosis not present

## 2021-08-24 DIAGNOSIS — K648 Other hemorrhoids: Secondary | ICD-10-CM | POA: Diagnosis not present

## 2021-08-24 DIAGNOSIS — Z86010 Personal history of colonic polyps: Secondary | ICD-10-CM | POA: Diagnosis not present

## 2021-08-24 DIAGNOSIS — L814 Other melanin hyperpigmentation: Secondary | ICD-10-CM | POA: Diagnosis not present

## 2021-09-09 DIAGNOSIS — M9906 Segmental and somatic dysfunction of lower extremity: Secondary | ICD-10-CM | POA: Diagnosis not present

## 2021-09-09 DIAGNOSIS — M7542 Impingement syndrome of left shoulder: Secondary | ICD-10-CM | POA: Diagnosis not present

## 2021-09-09 DIAGNOSIS — M9905 Segmental and somatic dysfunction of pelvic region: Secondary | ICD-10-CM | POA: Diagnosis not present

## 2021-09-09 DIAGNOSIS — M9903 Segmental and somatic dysfunction of lumbar region: Secondary | ICD-10-CM | POA: Diagnosis not present

## 2021-09-09 DIAGNOSIS — M9902 Segmental and somatic dysfunction of thoracic region: Secondary | ICD-10-CM | POA: Diagnosis not present

## 2021-09-09 DIAGNOSIS — M7061 Trochanteric bursitis, right hip: Secondary | ICD-10-CM | POA: Diagnosis not present

## 2021-09-28 DIAGNOSIS — M7542 Impingement syndrome of left shoulder: Secondary | ICD-10-CM | POA: Diagnosis not present

## 2021-09-28 DIAGNOSIS — M9906 Segmental and somatic dysfunction of lower extremity: Secondary | ICD-10-CM | POA: Diagnosis not present

## 2021-09-28 DIAGNOSIS — M9903 Segmental and somatic dysfunction of lumbar region: Secondary | ICD-10-CM | POA: Diagnosis not present

## 2021-09-28 DIAGNOSIS — M9902 Segmental and somatic dysfunction of thoracic region: Secondary | ICD-10-CM | POA: Diagnosis not present

## 2021-09-28 DIAGNOSIS — M9905 Segmental and somatic dysfunction of pelvic region: Secondary | ICD-10-CM | POA: Diagnosis not present

## 2021-09-29 DIAGNOSIS — M9905 Segmental and somatic dysfunction of pelvic region: Secondary | ICD-10-CM | POA: Diagnosis not present

## 2021-09-29 DIAGNOSIS — M9903 Segmental and somatic dysfunction of lumbar region: Secondary | ICD-10-CM | POA: Diagnosis not present

## 2021-09-29 DIAGNOSIS — M9906 Segmental and somatic dysfunction of lower extremity: Secondary | ICD-10-CM | POA: Diagnosis not present

## 2021-09-29 DIAGNOSIS — M7542 Impingement syndrome of left shoulder: Secondary | ICD-10-CM | POA: Diagnosis not present

## 2021-09-29 DIAGNOSIS — M9902 Segmental and somatic dysfunction of thoracic region: Secondary | ICD-10-CM | POA: Diagnosis not present

## 2021-10-27 DIAGNOSIS — M9905 Segmental and somatic dysfunction of pelvic region: Secondary | ICD-10-CM | POA: Diagnosis not present

## 2021-10-27 DIAGNOSIS — M9903 Segmental and somatic dysfunction of lumbar region: Secondary | ICD-10-CM | POA: Diagnosis not present

## 2021-10-27 DIAGNOSIS — M7542 Impingement syndrome of left shoulder: Secondary | ICD-10-CM | POA: Diagnosis not present

## 2021-10-27 DIAGNOSIS — M9906 Segmental and somatic dysfunction of lower extremity: Secondary | ICD-10-CM | POA: Diagnosis not present

## 2021-10-27 DIAGNOSIS — M9902 Segmental and somatic dysfunction of thoracic region: Secondary | ICD-10-CM | POA: Diagnosis not present

## 2021-12-07 DIAGNOSIS — M9905 Segmental and somatic dysfunction of pelvic region: Secondary | ICD-10-CM | POA: Diagnosis not present

## 2021-12-07 DIAGNOSIS — M9906 Segmental and somatic dysfunction of lower extremity: Secondary | ICD-10-CM | POA: Diagnosis not present

## 2021-12-07 DIAGNOSIS — M7542 Impingement syndrome of left shoulder: Secondary | ICD-10-CM | POA: Diagnosis not present

## 2021-12-07 DIAGNOSIS — M9902 Segmental and somatic dysfunction of thoracic region: Secondary | ICD-10-CM | POA: Diagnosis not present

## 2021-12-07 DIAGNOSIS — M9903 Segmental and somatic dysfunction of lumbar region: Secondary | ICD-10-CM | POA: Diagnosis not present

## 2022-01-01 DIAGNOSIS — N3000 Acute cystitis without hematuria: Secondary | ICD-10-CM | POA: Diagnosis not present

## 2022-01-01 DIAGNOSIS — R300 Dysuria: Secondary | ICD-10-CM | POA: Diagnosis not present

## 2022-01-02 DIAGNOSIS — M9906 Segmental and somatic dysfunction of lower extremity: Secondary | ICD-10-CM | POA: Diagnosis not present

## 2022-01-02 DIAGNOSIS — M7542 Impingement syndrome of left shoulder: Secondary | ICD-10-CM | POA: Diagnosis not present

## 2022-01-02 DIAGNOSIS — M9903 Segmental and somatic dysfunction of lumbar region: Secondary | ICD-10-CM | POA: Diagnosis not present

## 2022-01-02 DIAGNOSIS — M9905 Segmental and somatic dysfunction of pelvic region: Secondary | ICD-10-CM | POA: Diagnosis not present

## 2022-01-02 DIAGNOSIS — M9902 Segmental and somatic dysfunction of thoracic region: Secondary | ICD-10-CM | POA: Diagnosis not present

## 2022-01-26 DIAGNOSIS — B078 Other viral warts: Secondary | ICD-10-CM | POA: Diagnosis not present

## 2022-01-26 DIAGNOSIS — L503 Dermatographic urticaria: Secondary | ICD-10-CM | POA: Diagnosis not present

## 2022-02-23 DIAGNOSIS — M7542 Impingement syndrome of left shoulder: Secondary | ICD-10-CM | POA: Diagnosis not present

## 2022-02-23 DIAGNOSIS — M9902 Segmental and somatic dysfunction of thoracic region: Secondary | ICD-10-CM | POA: Diagnosis not present

## 2022-02-23 DIAGNOSIS — Z85828 Personal history of other malignant neoplasm of skin: Secondary | ICD-10-CM | POA: Diagnosis not present

## 2022-02-23 DIAGNOSIS — L570 Actinic keratosis: Secondary | ICD-10-CM | POA: Diagnosis not present

## 2022-02-23 DIAGNOSIS — M9906 Segmental and somatic dysfunction of lower extremity: Secondary | ICD-10-CM | POA: Diagnosis not present

## 2022-02-23 DIAGNOSIS — M9903 Segmental and somatic dysfunction of lumbar region: Secondary | ICD-10-CM | POA: Diagnosis not present

## 2022-02-23 DIAGNOSIS — M9905 Segmental and somatic dysfunction of pelvic region: Secondary | ICD-10-CM | POA: Diagnosis not present

## 2022-02-23 DIAGNOSIS — L821 Other seborrheic keratosis: Secondary | ICD-10-CM | POA: Diagnosis not present

## 2022-02-23 DIAGNOSIS — D0462 Carcinoma in situ of skin of left upper limb, including shoulder: Secondary | ICD-10-CM | POA: Diagnosis not present

## 2022-02-23 DIAGNOSIS — I8392 Asymptomatic varicose veins of left lower extremity: Secondary | ICD-10-CM | POA: Diagnosis not present

## 2022-02-23 DIAGNOSIS — L814 Other melanin hyperpigmentation: Secondary | ICD-10-CM | POA: Diagnosis not present

## 2022-03-17 DIAGNOSIS — E7800 Pure hypercholesterolemia, unspecified: Secondary | ICD-10-CM | POA: Diagnosis not present

## 2022-03-17 DIAGNOSIS — Z131 Encounter for screening for diabetes mellitus: Secondary | ICD-10-CM | POA: Diagnosis not present

## 2022-03-17 DIAGNOSIS — Z0000 Encounter for general adult medical examination without abnormal findings: Secondary | ICD-10-CM | POA: Diagnosis not present

## 2022-03-17 DIAGNOSIS — R42 Dizziness and giddiness: Secondary | ICD-10-CM | POA: Diagnosis not present

## 2022-03-30 DIAGNOSIS — Z23 Encounter for immunization: Secondary | ICD-10-CM | POA: Diagnosis not present

## 2022-05-03 DIAGNOSIS — M9905 Segmental and somatic dysfunction of pelvic region: Secondary | ICD-10-CM | POA: Diagnosis not present

## 2022-05-03 DIAGNOSIS — M9906 Segmental and somatic dysfunction of lower extremity: Secondary | ICD-10-CM | POA: Diagnosis not present

## 2022-05-03 DIAGNOSIS — M9903 Segmental and somatic dysfunction of lumbar region: Secondary | ICD-10-CM | POA: Diagnosis not present

## 2022-05-03 DIAGNOSIS — R300 Dysuria: Secondary | ICD-10-CM | POA: Diagnosis not present

## 2022-05-03 DIAGNOSIS — M9902 Segmental and somatic dysfunction of thoracic region: Secondary | ICD-10-CM | POA: Diagnosis not present

## 2022-05-03 DIAGNOSIS — M7542 Impingement syndrome of left shoulder: Secondary | ICD-10-CM | POA: Diagnosis not present

## 2022-05-22 DIAGNOSIS — M9905 Segmental and somatic dysfunction of pelvic region: Secondary | ICD-10-CM | POA: Diagnosis not present

## 2022-05-22 DIAGNOSIS — M9906 Segmental and somatic dysfunction of lower extremity: Secondary | ICD-10-CM | POA: Diagnosis not present

## 2022-05-22 DIAGNOSIS — M7542 Impingement syndrome of left shoulder: Secondary | ICD-10-CM | POA: Diagnosis not present

## 2022-05-22 DIAGNOSIS — M9902 Segmental and somatic dysfunction of thoracic region: Secondary | ICD-10-CM | POA: Diagnosis not present

## 2022-05-22 DIAGNOSIS — M9903 Segmental and somatic dysfunction of lumbar region: Secondary | ICD-10-CM | POA: Diagnosis not present

## 2022-05-31 DIAGNOSIS — M9902 Segmental and somatic dysfunction of thoracic region: Secondary | ICD-10-CM | POA: Diagnosis not present

## 2022-05-31 DIAGNOSIS — M9903 Segmental and somatic dysfunction of lumbar region: Secondary | ICD-10-CM | POA: Diagnosis not present

## 2022-05-31 DIAGNOSIS — M7542 Impingement syndrome of left shoulder: Secondary | ICD-10-CM | POA: Diagnosis not present

## 2022-05-31 DIAGNOSIS — M9906 Segmental and somatic dysfunction of lower extremity: Secondary | ICD-10-CM | POA: Diagnosis not present

## 2022-05-31 DIAGNOSIS — M9905 Segmental and somatic dysfunction of pelvic region: Secondary | ICD-10-CM | POA: Diagnosis not present

## 2022-06-02 DIAGNOSIS — Z1231 Encounter for screening mammogram for malignant neoplasm of breast: Secondary | ICD-10-CM | POA: Diagnosis not present

## 2022-06-19 DIAGNOSIS — M7542 Impingement syndrome of left shoulder: Secondary | ICD-10-CM | POA: Diagnosis not present

## 2022-06-19 DIAGNOSIS — M9905 Segmental and somatic dysfunction of pelvic region: Secondary | ICD-10-CM | POA: Diagnosis not present

## 2022-06-19 DIAGNOSIS — M9902 Segmental and somatic dysfunction of thoracic region: Secondary | ICD-10-CM | POA: Diagnosis not present

## 2022-06-19 DIAGNOSIS — M9906 Segmental and somatic dysfunction of lower extremity: Secondary | ICD-10-CM | POA: Diagnosis not present

## 2022-06-19 DIAGNOSIS — M9903 Segmental and somatic dysfunction of lumbar region: Secondary | ICD-10-CM | POA: Diagnosis not present

## 2022-06-22 DIAGNOSIS — M9905 Segmental and somatic dysfunction of pelvic region: Secondary | ICD-10-CM | POA: Diagnosis not present

## 2022-06-22 DIAGNOSIS — M7542 Impingement syndrome of left shoulder: Secondary | ICD-10-CM | POA: Diagnosis not present

## 2022-06-22 DIAGNOSIS — M9902 Segmental and somatic dysfunction of thoracic region: Secondary | ICD-10-CM | POA: Diagnosis not present

## 2022-06-22 DIAGNOSIS — M9903 Segmental and somatic dysfunction of lumbar region: Secondary | ICD-10-CM | POA: Diagnosis not present

## 2022-06-22 DIAGNOSIS — M9906 Segmental and somatic dysfunction of lower extremity: Secondary | ICD-10-CM | POA: Diagnosis not present

## 2022-07-25 DIAGNOSIS — M9905 Segmental and somatic dysfunction of pelvic region: Secondary | ICD-10-CM | POA: Diagnosis not present

## 2022-07-25 DIAGNOSIS — M9903 Segmental and somatic dysfunction of lumbar region: Secondary | ICD-10-CM | POA: Diagnosis not present

## 2022-07-25 DIAGNOSIS — M9902 Segmental and somatic dysfunction of thoracic region: Secondary | ICD-10-CM | POA: Diagnosis not present

## 2022-07-25 DIAGNOSIS — M7542 Impingement syndrome of left shoulder: Secondary | ICD-10-CM | POA: Diagnosis not present

## 2022-07-25 DIAGNOSIS — M9906 Segmental and somatic dysfunction of lower extremity: Secondary | ICD-10-CM | POA: Diagnosis not present

## 2022-08-08 DIAGNOSIS — M9902 Segmental and somatic dysfunction of thoracic region: Secondary | ICD-10-CM | POA: Diagnosis not present

## 2022-08-08 DIAGNOSIS — M9906 Segmental and somatic dysfunction of lower extremity: Secondary | ICD-10-CM | POA: Diagnosis not present

## 2022-08-08 DIAGNOSIS — M7542 Impingement syndrome of left shoulder: Secondary | ICD-10-CM | POA: Diagnosis not present

## 2022-08-08 DIAGNOSIS — M9903 Segmental and somatic dysfunction of lumbar region: Secondary | ICD-10-CM | POA: Diagnosis not present

## 2022-08-08 DIAGNOSIS — M9905 Segmental and somatic dysfunction of pelvic region: Secondary | ICD-10-CM | POA: Diagnosis not present

## 2022-08-21 DIAGNOSIS — M7542 Impingement syndrome of left shoulder: Secondary | ICD-10-CM | POA: Diagnosis not present

## 2022-08-21 DIAGNOSIS — M9903 Segmental and somatic dysfunction of lumbar region: Secondary | ICD-10-CM | POA: Diagnosis not present

## 2022-08-21 DIAGNOSIS — M9905 Segmental and somatic dysfunction of pelvic region: Secondary | ICD-10-CM | POA: Diagnosis not present

## 2022-08-21 DIAGNOSIS — M9906 Segmental and somatic dysfunction of lower extremity: Secondary | ICD-10-CM | POA: Diagnosis not present

## 2022-08-21 DIAGNOSIS — H5203 Hypermetropia, bilateral: Secondary | ICD-10-CM | POA: Diagnosis not present

## 2022-08-21 DIAGNOSIS — H04123 Dry eye syndrome of bilateral lacrimal glands: Secondary | ICD-10-CM | POA: Diagnosis not present

## 2022-08-21 DIAGNOSIS — H2513 Age-related nuclear cataract, bilateral: Secondary | ICD-10-CM | POA: Diagnosis not present

## 2022-08-21 DIAGNOSIS — H524 Presbyopia: Secondary | ICD-10-CM | POA: Diagnosis not present

## 2022-08-21 DIAGNOSIS — M9902 Segmental and somatic dysfunction of thoracic region: Secondary | ICD-10-CM | POA: Diagnosis not present

## 2022-08-23 DIAGNOSIS — R87615 Unsatisfactory cytologic smear of cervix: Secondary | ICD-10-CM | POA: Diagnosis not present

## 2022-08-23 DIAGNOSIS — Z01419 Encounter for gynecological examination (general) (routine) without abnormal findings: Secondary | ICD-10-CM | POA: Diagnosis not present

## 2022-08-23 DIAGNOSIS — Z1151 Encounter for screening for human papillomavirus (HPV): Secondary | ICD-10-CM | POA: Diagnosis not present

## 2022-08-23 DIAGNOSIS — Z124 Encounter for screening for malignant neoplasm of cervix: Secondary | ICD-10-CM | POA: Diagnosis not present

## 2022-08-30 DIAGNOSIS — L570 Actinic keratosis: Secondary | ICD-10-CM | POA: Diagnosis not present

## 2022-08-30 DIAGNOSIS — D225 Melanocytic nevi of trunk: Secondary | ICD-10-CM | POA: Diagnosis not present

## 2022-08-30 DIAGNOSIS — C44629 Squamous cell carcinoma of skin of left upper limb, including shoulder: Secondary | ICD-10-CM | POA: Diagnosis not present

## 2022-08-30 DIAGNOSIS — Z85828 Personal history of other malignant neoplasm of skin: Secondary | ICD-10-CM | POA: Diagnosis not present

## 2022-08-30 DIAGNOSIS — L820 Inflamed seborrheic keratosis: Secondary | ICD-10-CM | POA: Diagnosis not present

## 2022-08-30 DIAGNOSIS — L814 Other melanin hyperpigmentation: Secondary | ICD-10-CM | POA: Diagnosis not present

## 2022-09-13 DIAGNOSIS — Z1151 Encounter for screening for human papillomavirus (HPV): Secondary | ICD-10-CM | POA: Diagnosis not present

## 2022-09-13 DIAGNOSIS — Z124 Encounter for screening for malignant neoplasm of cervix: Secondary | ICD-10-CM | POA: Diagnosis not present

## 2022-11-24 DIAGNOSIS — N390 Urinary tract infection, site not specified: Secondary | ICD-10-CM | POA: Diagnosis not present

## 2022-11-24 DIAGNOSIS — R350 Frequency of micturition: Secondary | ICD-10-CM | POA: Diagnosis not present

## 2023-02-27 DIAGNOSIS — L570 Actinic keratosis: Secondary | ICD-10-CM | POA: Diagnosis not present

## 2023-02-27 DIAGNOSIS — Z85828 Personal history of other malignant neoplasm of skin: Secondary | ICD-10-CM | POA: Diagnosis not present

## 2023-02-27 DIAGNOSIS — L814 Other melanin hyperpigmentation: Secondary | ICD-10-CM | POA: Diagnosis not present

## 2023-02-27 DIAGNOSIS — D225 Melanocytic nevi of trunk: Secondary | ICD-10-CM | POA: Diagnosis not present

## 2023-02-27 DIAGNOSIS — L438 Other lichen planus: Secondary | ICD-10-CM | POA: Diagnosis not present

## 2023-02-27 DIAGNOSIS — D045 Carcinoma in situ of skin of trunk: Secondary | ICD-10-CM | POA: Diagnosis not present

## 2023-02-27 DIAGNOSIS — C44612 Basal cell carcinoma of skin of right upper limb, including shoulder: Secondary | ICD-10-CM | POA: Diagnosis not present

## 2023-03-12 DIAGNOSIS — M9906 Segmental and somatic dysfunction of lower extremity: Secondary | ICD-10-CM | POA: Diagnosis not present

## 2023-03-12 DIAGNOSIS — M9902 Segmental and somatic dysfunction of thoracic region: Secondary | ICD-10-CM | POA: Diagnosis not present

## 2023-03-12 DIAGNOSIS — M7542 Impingement syndrome of left shoulder: Secondary | ICD-10-CM | POA: Diagnosis not present

## 2023-03-12 DIAGNOSIS — M9905 Segmental and somatic dysfunction of pelvic region: Secondary | ICD-10-CM | POA: Diagnosis not present

## 2023-03-12 DIAGNOSIS — M9903 Segmental and somatic dysfunction of lumbar region: Secondary | ICD-10-CM | POA: Diagnosis not present

## 2023-03-15 DIAGNOSIS — M1612 Unilateral primary osteoarthritis, left hip: Secondary | ICD-10-CM | POA: Diagnosis not present

## 2023-03-15 DIAGNOSIS — M9902 Segmental and somatic dysfunction of thoracic region: Secondary | ICD-10-CM | POA: Diagnosis not present

## 2023-03-15 DIAGNOSIS — M7542 Impingement syndrome of left shoulder: Secondary | ICD-10-CM | POA: Diagnosis not present

## 2023-03-15 DIAGNOSIS — M9905 Segmental and somatic dysfunction of pelvic region: Secondary | ICD-10-CM | POA: Diagnosis not present

## 2023-03-15 DIAGNOSIS — M9903 Segmental and somatic dysfunction of lumbar region: Secondary | ICD-10-CM | POA: Diagnosis not present

## 2023-03-16 DIAGNOSIS — M7542 Impingement syndrome of left shoulder: Secondary | ICD-10-CM | POA: Diagnosis not present

## 2023-03-16 DIAGNOSIS — M9902 Segmental and somatic dysfunction of thoracic region: Secondary | ICD-10-CM | POA: Diagnosis not present

## 2023-03-16 DIAGNOSIS — M9905 Segmental and somatic dysfunction of pelvic region: Secondary | ICD-10-CM | POA: Diagnosis not present

## 2023-03-16 DIAGNOSIS — M9903 Segmental and somatic dysfunction of lumbar region: Secondary | ICD-10-CM | POA: Diagnosis not present

## 2023-03-16 DIAGNOSIS — M9906 Segmental and somatic dysfunction of lower extremity: Secondary | ICD-10-CM | POA: Diagnosis not present

## 2023-03-19 DIAGNOSIS — M9903 Segmental and somatic dysfunction of lumbar region: Secondary | ICD-10-CM | POA: Diagnosis not present

## 2023-03-19 DIAGNOSIS — M7542 Impingement syndrome of left shoulder: Secondary | ICD-10-CM | POA: Diagnosis not present

## 2023-03-19 DIAGNOSIS — M9905 Segmental and somatic dysfunction of pelvic region: Secondary | ICD-10-CM | POA: Diagnosis not present

## 2023-03-19 DIAGNOSIS — M9902 Segmental and somatic dysfunction of thoracic region: Secondary | ICD-10-CM | POA: Diagnosis not present

## 2023-03-19 DIAGNOSIS — M9906 Segmental and somatic dysfunction of lower extremity: Secondary | ICD-10-CM | POA: Diagnosis not present

## 2023-03-21 DIAGNOSIS — M25552 Pain in left hip: Secondary | ICD-10-CM | POA: Diagnosis not present

## 2023-03-21 DIAGNOSIS — Z0000 Encounter for general adult medical examination without abnormal findings: Secondary | ICD-10-CM | POA: Diagnosis not present

## 2023-03-21 DIAGNOSIS — E7800 Pure hypercholesterolemia, unspecified: Secondary | ICD-10-CM | POA: Diagnosis not present

## 2023-03-22 DIAGNOSIS — M9906 Segmental and somatic dysfunction of lower extremity: Secondary | ICD-10-CM | POA: Diagnosis not present

## 2023-03-22 DIAGNOSIS — M7542 Impingement syndrome of left shoulder: Secondary | ICD-10-CM | POA: Diagnosis not present

## 2023-03-22 DIAGNOSIS — M9903 Segmental and somatic dysfunction of lumbar region: Secondary | ICD-10-CM | POA: Diagnosis not present

## 2023-03-22 DIAGNOSIS — M9905 Segmental and somatic dysfunction of pelvic region: Secondary | ICD-10-CM | POA: Diagnosis not present

## 2023-03-22 DIAGNOSIS — M9902 Segmental and somatic dysfunction of thoracic region: Secondary | ICD-10-CM | POA: Diagnosis not present

## 2023-03-23 ENCOUNTER — Other Ambulatory Visit (HOSPITAL_COMMUNITY): Payer: Self-pay | Admitting: Internal Medicine

## 2023-03-23 DIAGNOSIS — E7800 Pure hypercholesterolemia, unspecified: Secondary | ICD-10-CM

## 2023-03-26 DIAGNOSIS — M9903 Segmental and somatic dysfunction of lumbar region: Secondary | ICD-10-CM | POA: Diagnosis not present

## 2023-03-26 DIAGNOSIS — M9906 Segmental and somatic dysfunction of lower extremity: Secondary | ICD-10-CM | POA: Diagnosis not present

## 2023-03-26 DIAGNOSIS — M7542 Impingement syndrome of left shoulder: Secondary | ICD-10-CM | POA: Diagnosis not present

## 2023-03-26 DIAGNOSIS — M9902 Segmental and somatic dysfunction of thoracic region: Secondary | ICD-10-CM | POA: Diagnosis not present

## 2023-03-26 DIAGNOSIS — M9905 Segmental and somatic dysfunction of pelvic region: Secondary | ICD-10-CM | POA: Diagnosis not present

## 2023-03-30 DIAGNOSIS — M9905 Segmental and somatic dysfunction of pelvic region: Secondary | ICD-10-CM | POA: Diagnosis not present

## 2023-03-30 DIAGNOSIS — M9906 Segmental and somatic dysfunction of lower extremity: Secondary | ICD-10-CM | POA: Diagnosis not present

## 2023-03-30 DIAGNOSIS — M7542 Impingement syndrome of left shoulder: Secondary | ICD-10-CM | POA: Diagnosis not present

## 2023-03-30 DIAGNOSIS — M9902 Segmental and somatic dysfunction of thoracic region: Secondary | ICD-10-CM | POA: Diagnosis not present

## 2023-03-30 DIAGNOSIS — M9903 Segmental and somatic dysfunction of lumbar region: Secondary | ICD-10-CM | POA: Diagnosis not present

## 2023-04-02 ENCOUNTER — Ambulatory Visit (HOSPITAL_COMMUNITY)
Admission: RE | Admit: 2023-04-02 | Discharge: 2023-04-02 | Disposition: A | Discharge status: Home / Self Care | Payer: Self-pay | Source: Ambulatory Visit | Attending: Internal Medicine | Admitting: Internal Medicine

## 2023-04-02 DIAGNOSIS — E7800 Pure hypercholesterolemia, unspecified: Secondary | ICD-10-CM | POA: Insufficient documentation

## 2023-04-05 DIAGNOSIS — M9903 Segmental and somatic dysfunction of lumbar region: Secondary | ICD-10-CM | POA: Diagnosis not present

## 2023-04-05 DIAGNOSIS — M9905 Segmental and somatic dysfunction of pelvic region: Secondary | ICD-10-CM | POA: Diagnosis not present

## 2023-04-05 DIAGNOSIS — M9906 Segmental and somatic dysfunction of lower extremity: Secondary | ICD-10-CM | POA: Diagnosis not present

## 2023-04-05 DIAGNOSIS — M9902 Segmental and somatic dysfunction of thoracic region: Secondary | ICD-10-CM | POA: Diagnosis not present

## 2023-04-05 DIAGNOSIS — M7542 Impingement syndrome of left shoulder: Secondary | ICD-10-CM | POA: Diagnosis not present

## 2023-06-25 DIAGNOSIS — Z1231 Encounter for screening mammogram for malignant neoplasm of breast: Secondary | ICD-10-CM | POA: Diagnosis not present

## 2023-08-22 DIAGNOSIS — L814 Other melanin hyperpigmentation: Secondary | ICD-10-CM | POA: Diagnosis not present

## 2023-08-22 DIAGNOSIS — L720 Epidermal cyst: Secondary | ICD-10-CM | POA: Diagnosis not present

## 2023-08-22 DIAGNOSIS — L821 Other seborrheic keratosis: Secondary | ICD-10-CM | POA: Diagnosis not present

## 2023-08-22 DIAGNOSIS — L570 Actinic keratosis: Secondary | ICD-10-CM | POA: Diagnosis not present

## 2023-08-22 DIAGNOSIS — Z85828 Personal history of other malignant neoplasm of skin: Secondary | ICD-10-CM | POA: Diagnosis not present

## 2023-10-24 DIAGNOSIS — M9902 Segmental and somatic dysfunction of thoracic region: Secondary | ICD-10-CM | POA: Diagnosis not present

## 2023-10-24 DIAGNOSIS — M9903 Segmental and somatic dysfunction of lumbar region: Secondary | ICD-10-CM | POA: Diagnosis not present

## 2023-10-24 DIAGNOSIS — M9905 Segmental and somatic dysfunction of pelvic region: Secondary | ICD-10-CM | POA: Diagnosis not present

## 2023-10-24 DIAGNOSIS — M7542 Impingement syndrome of left shoulder: Secondary | ICD-10-CM | POA: Diagnosis not present

## 2023-10-25 DIAGNOSIS — Z124 Encounter for screening for malignant neoplasm of cervix: Secondary | ICD-10-CM | POA: Diagnosis not present

## 2023-10-25 DIAGNOSIS — Z6829 Body mass index (BMI) 29.0-29.9, adult: Secondary | ICD-10-CM | POA: Diagnosis not present

## 2023-10-25 DIAGNOSIS — Z1151 Encounter for screening for human papillomavirus (HPV): Secondary | ICD-10-CM | POA: Diagnosis not present

## 2023-10-25 DIAGNOSIS — Z01419 Encounter for gynecological examination (general) (routine) without abnormal findings: Secondary | ICD-10-CM | POA: Diagnosis not present

## 2023-10-26 DIAGNOSIS — M7542 Impingement syndrome of left shoulder: Secondary | ICD-10-CM | POA: Diagnosis not present

## 2023-10-26 DIAGNOSIS — M9903 Segmental and somatic dysfunction of lumbar region: Secondary | ICD-10-CM | POA: Diagnosis not present

## 2023-10-26 DIAGNOSIS — M9905 Segmental and somatic dysfunction of pelvic region: Secondary | ICD-10-CM | POA: Diagnosis not present

## 2023-10-26 DIAGNOSIS — M9902 Segmental and somatic dysfunction of thoracic region: Secondary | ICD-10-CM | POA: Diagnosis not present

## 2023-11-12 DIAGNOSIS — M9905 Segmental and somatic dysfunction of pelvic region: Secondary | ICD-10-CM | POA: Diagnosis not present

## 2023-11-12 DIAGNOSIS — M9903 Segmental and somatic dysfunction of lumbar region: Secondary | ICD-10-CM | POA: Diagnosis not present

## 2023-11-12 DIAGNOSIS — M7542 Impingement syndrome of left shoulder: Secondary | ICD-10-CM | POA: Diagnosis not present

## 2023-11-12 DIAGNOSIS — M9902 Segmental and somatic dysfunction of thoracic region: Secondary | ICD-10-CM | POA: Diagnosis not present

## 2023-11-15 DIAGNOSIS — M9902 Segmental and somatic dysfunction of thoracic region: Secondary | ICD-10-CM | POA: Diagnosis not present

## 2023-11-15 DIAGNOSIS — M5451 Vertebrogenic low back pain: Secondary | ICD-10-CM | POA: Diagnosis not present

## 2023-11-15 DIAGNOSIS — M9905 Segmental and somatic dysfunction of pelvic region: Secondary | ICD-10-CM | POA: Diagnosis not present

## 2023-11-15 DIAGNOSIS — M9903 Segmental and somatic dysfunction of lumbar region: Secondary | ICD-10-CM | POA: Diagnosis not present

## 2024-01-16 DIAGNOSIS — M9903 Segmental and somatic dysfunction of lumbar region: Secondary | ICD-10-CM | POA: Diagnosis not present

## 2024-01-16 DIAGNOSIS — M5451 Vertebrogenic low back pain: Secondary | ICD-10-CM | POA: Diagnosis not present

## 2024-01-16 DIAGNOSIS — M9902 Segmental and somatic dysfunction of thoracic region: Secondary | ICD-10-CM | POA: Diagnosis not present

## 2024-01-16 DIAGNOSIS — M9905 Segmental and somatic dysfunction of pelvic region: Secondary | ICD-10-CM | POA: Diagnosis not present

## 2024-02-06 DIAGNOSIS — L820 Inflamed seborrheic keratosis: Secondary | ICD-10-CM | POA: Diagnosis not present

## 2024-02-06 DIAGNOSIS — M9905 Segmental and somatic dysfunction of pelvic region: Secondary | ICD-10-CM | POA: Diagnosis not present

## 2024-02-06 DIAGNOSIS — L821 Other seborrheic keratosis: Secondary | ICD-10-CM | POA: Diagnosis not present

## 2024-02-06 DIAGNOSIS — M9903 Segmental and somatic dysfunction of lumbar region: Secondary | ICD-10-CM | POA: Diagnosis not present

## 2024-02-06 DIAGNOSIS — M5451 Vertebrogenic low back pain: Secondary | ICD-10-CM | POA: Diagnosis not present

## 2024-02-06 DIAGNOSIS — L570 Actinic keratosis: Secondary | ICD-10-CM | POA: Diagnosis not present

## 2024-02-06 DIAGNOSIS — M9902 Segmental and somatic dysfunction of thoracic region: Secondary | ICD-10-CM | POA: Diagnosis not present

## 2024-02-06 DIAGNOSIS — D2261 Melanocytic nevi of right upper limb, including shoulder: Secondary | ICD-10-CM | POA: Diagnosis not present

## 2024-02-06 DIAGNOSIS — Z85828 Personal history of other malignant neoplasm of skin: Secondary | ICD-10-CM | POA: Diagnosis not present
